# Patient Record
Sex: Female | Born: 1975 | Race: White | Hispanic: No | Marital: Single | Smoking: Never smoker
Health system: Southern US, Community
[De-identification: ages and names within clinical notes are randomized; demographics above are authoritative.]

## PROBLEM LIST (undated history)

## (undated) DIAGNOSIS — D649 Anemia, unspecified: Secondary | ICD-10-CM

## (undated) DIAGNOSIS — J45909 Unspecified asthma, uncomplicated: Secondary | ICD-10-CM

## (undated) DIAGNOSIS — E079 Disorder of thyroid, unspecified: Secondary | ICD-10-CM

## (undated) HISTORY — PX: GASTRIC BYPASS: SHX52

## (undated) HISTORY — PX: OTHER SURGICAL HISTORY: SHX169

---

## 1998-12-01 ENCOUNTER — Emergency Department (HOSPITAL_COMMUNITY): Admission: EM | Admit: 1998-12-01 | Discharge: 1998-12-01 | Payer: Self-pay | Admitting: Family Medicine

## 2003-10-12 ENCOUNTER — Encounter: Admission: RE | Admit: 2003-10-12 | Discharge: 2003-10-12 | Payer: Self-pay | Admitting: Family Medicine

## 2003-10-12 ENCOUNTER — Encounter: Payer: Self-pay | Admitting: Family Medicine

## 2003-10-16 ENCOUNTER — Encounter: Admission: RE | Admit: 2003-10-16 | Discharge: 2003-10-16 | Payer: Self-pay | Admitting: Family Medicine

## 2011-01-07 ENCOUNTER — Encounter: Payer: Self-pay | Admitting: Family Medicine

## 2011-01-08 ENCOUNTER — Encounter: Payer: Self-pay | Admitting: Family Medicine

## 2021-06-01 ENCOUNTER — Ambulatory Visit: Payer: Self-pay | Admitting: *Deleted

## 2021-06-01 NOTE — Telephone Encounter (Signed)
Itchy and burning red rash appeared this morning on left arm. Bumpy and raised no drainage/scaling. Since first appeared several spots now on other arm and forehead. No other symptoms. No new lotions, etc. Was in contact with someone with poison oak recently.  Provided advice on relieving the itching and preventing spreading. Calamine lotion, antihistamine and protection from touching, do not scratch. If turns purple, spreads near the mouth/eyes, worsens call back. Patient understands advice.   Reason for Disposition  SEVERE itching (i.e., interferes with sleep, normal activities or school)  Answer Assessment - Initial Assessment Questions 1. APPEARANCE of RASH: "Describe the rash." (e.g., spots, blisters, raised areas, skin peeling, scaly)     Bumpy and feels like sandpaper 2. SIZE: "How big are the spots?" (e.g., tip of pen, eraser, coin; inches, centimeters)    Eraser and dime side 3. LOCATION: "Where is the rash located?"     One arm and spots 4. COLOR: "What color is the rash?" (Note: It is difficult to assess rash color in people with darker-colored skin. When this situation occurs, simply ask the caller to describe what they see.)   red 5. ONSET: "When did the rash begin?"     yesterday 6. FEVER: "Do you have a fever?" If Yes, ask: "What is your temperature, how was it measured, and when did it start?"     no 7. ITCHING: "Does the rash itch?" If Yes, ask: "How bad is the itch?" (Scale 1-10; or mild, moderate, severe)    Itch and burns 8. CAUSE: "What do you think is causing the rash?"     unknown 9. MEDICINE FACTORS: "Have you started any new medicines within the last 2 weeks?" (e.g., antibiotics)      no 10. OTHER SYMPTOMS: "Do you have any other symptoms?" (e.g., dizziness, headache, sore throat, joint pain)       no 11. PREGNANCY: "Is there any chance you are pregnant?" "When was your last menstrual period?"       na  Protocols used: Rash or Redness - [facility]

## 2021-06-01 NOTE — Telephone Encounter (Signed)
Itchy burning rash developed this morning on

## 2021-08-21 ENCOUNTER — Other Ambulatory Visit: Payer: Self-pay

## 2021-08-21 ENCOUNTER — Emergency Department (HOSPITAL_COMMUNITY)
Admission: EM | Admit: 2021-08-21 | Discharge: 2021-08-21 | Disposition: A | Discharge status: Home / Self Care | Payer: Self-pay | Attending: Emergency Medicine | Admitting: Emergency Medicine

## 2021-08-21 ENCOUNTER — Encounter (HOSPITAL_COMMUNITY): Payer: Self-pay

## 2021-08-21 DIAGNOSIS — R112 Nausea with vomiting, unspecified: Secondary | ICD-10-CM | POA: Insufficient documentation

## 2021-08-21 DIAGNOSIS — R197 Diarrhea, unspecified: Secondary | ICD-10-CM | POA: Insufficient documentation

## 2021-08-21 DIAGNOSIS — Z5321 Procedure and treatment not carried out due to patient leaving prior to being seen by health care provider: Secondary | ICD-10-CM | POA: Insufficient documentation

## 2021-08-21 DIAGNOSIS — R6883 Chills (without fever): Secondary | ICD-10-CM | POA: Insufficient documentation

## 2021-08-21 LAB — CBC
HCT: 30.7 % — ABNORMAL LOW (ref 36.0–46.0)
Hemoglobin: 9.1 g/dL — ABNORMAL LOW (ref 12.0–15.0)
MCH: 22.3 pg — ABNORMAL LOW (ref 26.0–34.0)
MCHC: 29.6 g/dL — ABNORMAL LOW (ref 30.0–36.0)
MCV: 75.2 fL — ABNORMAL LOW (ref 80.0–100.0)
Platelets: 410 10*3/uL — ABNORMAL HIGH (ref 150–400)
RBC: 4.08 MIL/uL (ref 3.87–5.11)
RDW: 18.8 % — ABNORMAL HIGH (ref 11.5–15.5)
WBC: 6.9 10*3/uL (ref 4.0–10.5)
nRBC: 0 % (ref 0.0–0.2)

## 2021-08-21 LAB — COMPREHENSIVE METABOLIC PANEL
ALT: 12 U/L (ref 0–44)
AST: 19 U/L (ref 15–41)
Albumin: 4.2 g/dL (ref 3.5–5.0)
Alkaline Phosphatase: 57 U/L (ref 38–126)
Anion gap: 11 (ref 5–15)
BUN: 7 mg/dL (ref 6–20)
CO2: 24 mmol/L (ref 22–32)
Calcium: 9 mg/dL (ref 8.9–10.3)
Chloride: 100 mmol/L (ref 98–111)
Creatinine, Ser: 0.44 mg/dL (ref 0.44–1.00)
GFR, Estimated: >60 mL/min (ref >60)
Glucose, Bld: 107 mg/dL — ABNORMAL HIGH (ref 70–99)
Potassium: 3.6 mmol/L (ref 3.5–5.1)
Sodium: 135 mmol/L (ref 135–145)
Total Bilirubin: 1 mg/dL (ref 0.3–1.2)
Total Protein: 7.7 g/dL (ref 6.5–8.1)

## 2021-08-21 LAB — LIPASE, BLOOD: Lipase: 21 U/L (ref 11–51)

## 2021-08-21 LAB — I-STAT BETA HCG BLOOD, ED (MC, WL, AP ONLY): I-stat hCG, quantitative: <5 m[IU]/mL (ref <5)

## 2021-08-21 MED ORDER — ONDANSETRON 4 MG PO TBDP
4.0000 mg | ORAL_TABLET | Freq: Once | ORAL | Status: AC
  Administered 2021-08-21: 4 mg via ORAL
  Filled 2021-08-21: qty 1

## 2021-08-21 NOTE — ED Provider Notes (Signed)
Emergency Medicine Provider Triage Evaluation Note  Karen Marshall , a 45 y.o. female  was evaluated in triage.  Pt complains of nausea, vomiting and diarrhea.  She says this started today with chills.  She denies any fevers. No severe abdominal pain.  History of similar. She has used zofran before but ran out.   Review of Systems  Positive: N/V/D Negative: syncope  Physical Exam  BP (!) 181/93   Pulse 71   Temp 98 F (36.7 C) (Oral)   Resp 18   SpO2 99%  Gen:   Awake, no distress   Resp:  Normal effort  MSK:   Moves extremities without difficulty  Other:  Appears to feel unwell.   Medical Decision Making  Medically screening exam initiated at 6:16 PM.  Appropriate orders placed.  Karen Marshall was informed that the remainder of the evaluation will be completed by another provider, this initial triage assessment does not replace that evaluation, and the importance of remaining in the ED until their evaluation is complete.  Note: Portions of this report may have been transcribed using voice recognition software. Every effort was made to ensure accuracy; however, inadvertent computerized transcription errors may be present    Norman Clay 08/21/21 1819    Linwood Dibbles, MD 08/22/21 [phone]

## 2021-08-21 NOTE — ED Triage Notes (Signed)
Pt reports N/V/D and chills this morning. Pt reports being unable to keep anything down.

## 2021-08-22 ENCOUNTER — Emergency Department (HOSPITAL_COMMUNITY)
Admission: EM | Admit: 2021-08-22 | Discharge: 2021-08-22 | Disposition: A | Discharge status: Home / Self Care | Payer: Self-pay | Attending: Emergency Medicine | Admitting: Emergency Medicine

## 2021-08-22 ENCOUNTER — Emergency Department (HOSPITAL_COMMUNITY): Payer: Self-pay

## 2021-08-22 DIAGNOSIS — R112 Nausea with vomiting, unspecified: Secondary | ICD-10-CM | POA: Insufficient documentation

## 2021-08-22 DIAGNOSIS — R109 Unspecified abdominal pain: Secondary | ICD-10-CM

## 2021-08-22 DIAGNOSIS — R197 Diarrhea, unspecified: Secondary | ICD-10-CM | POA: Insufficient documentation

## 2021-08-22 DIAGNOSIS — R1013 Epigastric pain: Secondary | ICD-10-CM | POA: Insufficient documentation

## 2021-08-22 LAB — CBC
HCT: 30.1 % — ABNORMAL LOW (ref 36.0–46.0)
Hemoglobin: 8.8 g/dL — ABNORMAL LOW (ref 12.0–15.0)
MCH: 22 pg — ABNORMAL LOW (ref 26.0–34.0)
MCHC: 29.2 g/dL — ABNORMAL LOW (ref 30.0–36.0)
MCV: 75.3 fL — ABNORMAL LOW (ref 80.0–100.0)
Platelets: 401 10*3/uL — ABNORMAL HIGH (ref 150–400)
RBC: 4 MIL/uL (ref 3.87–5.11)
RDW: 18.7 % — ABNORMAL HIGH (ref 11.5–15.5)
WBC: 5.7 10*3/uL (ref 4.0–10.5)
nRBC: 0 % (ref 0.0–0.2)

## 2021-08-22 LAB — COMPREHENSIVE METABOLIC PANEL
ALT: 11 U/L (ref 0–44)
AST: 19 U/L (ref 15–41)
Albumin: 4.1 g/dL (ref 3.5–5.0)
Alkaline Phosphatase: 55 U/L (ref 38–126)
Anion gap: 8 (ref 5–15)
BUN: 8 mg/dL (ref 6–20)
CO2: 27 mmol/L (ref 22–32)
Calcium: 9.1 mg/dL (ref 8.9–10.3)
Chloride: 101 mmol/L (ref 98–111)
Creatinine, Ser: 0.57 mg/dL (ref 0.44–1.00)
GFR, Estimated: >60 mL/min (ref >60)
Glucose, Bld: 97 mg/dL (ref 70–99)
Potassium: 3.3 mmol/L — ABNORMAL LOW (ref 3.5–5.1)
Sodium: 136 mmol/L (ref 135–145)
Total Bilirubin: 1.3 mg/dL — ABNORMAL HIGH (ref 0.3–1.2)
Total Protein: 7.5 g/dL (ref 6.5–8.1)

## 2021-08-22 LAB — LIPASE, BLOOD: Lipase: 23 U/L (ref 11–51)

## 2021-08-22 MED ORDER — FENTANYL CITRATE PF 50 MCG/ML IJ SOSY
25.0000 ug | PREFILLED_SYRINGE | Freq: Once | INTRAMUSCULAR | Status: AC
  Administered 2021-08-22: 25 ug via INTRAVENOUS
  Filled 2021-08-22: qty 1

## 2021-08-22 MED ORDER — ONDANSETRON HCL 4 MG/2ML IJ SOLN
4.0000 mg | Freq: Once | INTRAMUSCULAR | Status: AC
  Administered 2021-08-22: 4 mg via INTRAVENOUS
  Filled 2021-08-22: qty 2

## 2021-08-22 MED ORDER — SUCRALFATE 1 G PO TABS: 1.0000 g | ORAL_TABLET | Freq: Four times a day (QID) | ORAL | 0 refills | Status: AC | PRN

## 2021-08-22 MED ORDER — IOHEXOL 350 MG/ML SOLN
80.0000 mL | Freq: Once | INTRAVENOUS | Status: AC | PRN
  Administered 2021-08-22: 80 mL via INTRAVENOUS

## 2021-08-22 MED ORDER — FAMOTIDINE IN NACL 20-0.9 MG/50ML-% IV SOLN
20.0000 mg | Freq: Once | INTRAVENOUS | Status: AC
  Administered 2021-08-22: 20 mg via INTRAVENOUS
  Filled 2021-08-22: qty 50

## 2021-08-22 MED ORDER — SODIUM CHLORIDE 0.9 % IV BOLUS
1000.0000 mL | Freq: Once | INTRAVENOUS | Status: AC
  Administered 2021-08-22: 1000 mL via INTRAVENOUS

## 2021-08-22 MED ORDER — ONDANSETRON 4 MG PO TBDP: 4.0000 mg | ORAL_TABLET | Freq: Three times a day (TID) | ORAL | 0 refills | Status: DC | PRN

## 2021-08-22 NOTE — ED Triage Notes (Signed)
Pt complains of nausea, abdominal pain, and diarrhea. 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

## 2021-08-22 NOTE — Discharge Instructions (Addendum)
You were evaluated in the Emergency Department and after careful evaluation, we did not find any emergent condition requiring admission or further testing in the hospital.  Your exam/testing today is overall reassuring.  Symptoms seem to be due to inflammation of the lining of the stomach.  Continue taking your home antiacid medications.  You can also take the Carafate medication up to 4 times daily for immediate relief.  Can use the Zofran nausea medicine as needed.  Please return to the Emergency Department if you experience any worsening of your condition.   Thank you for allowing [place] to be a part of your care.

## 2021-08-22 NOTE — ED Provider Notes (Addendum)
[facility]-EMERGENCY DEPT [facility] Emergency Department Provider Note MRN:  [id number]  Arrival date & time: 08/22/21     Chief Complaint   Abdominal Pain, Nausea, and Diarrhea   History of Present Illness   Lucynda Rosano Casseus is a 45 y.o. year-old female with a history of gastric bypass presenting to the ED with chief complaint of abdominal pain.  Location: Epigastrium Duration: Several hours Onset: Gradual Timing: Constant Description: Sharp pressure Severity: Severe Exacerbating/Alleviating Factors: None Associated Symptoms: Nausea vomiting and diarrhea Pertinent Negatives: No chest pain or shortness of breath  Additional History: None  Review of Systems  A complete 10 system review of systems was obtained and all systems are negative except as noted in the HPI and PMH.   Patient's Health History   No past medical history on file.  Past Surgical History:  Procedure Laterality Date   gastic bypass      No family history on file.  Social History   Socioeconomic History   Marital status: Single    Spouse name: Not on file   Number of children: Not on file   Years of education: Not on file   Highest education level: Not on file  Occupational History   Not on file  Tobacco Use   Smoking status: Not on file   Smokeless tobacco: Not on file  Substance and Sexual Activity   Alcohol use: Not on file   Drug use: Not on file   Sexual activity: Not on file  Other Topics Concern   Not on file  Social History Narrative   Not on file   Social Determinants of Health   Financial Resource Strain: Not on file  Food Insecurity: Not on file  Transportation Needs: Not on file  Physical Activity: Not on file  Stress: Not on file  Social Connections: Not on file  Intimate Partner Violence: Not on file     Physical Exam   Vitals:   08/22/21 0700 08/22/21 0724  BP: (!) 145/104 (!) 151/92  Pulse: (!) 48 (!) 51  Resp: 20 18  Temp:  98.2 F (36.8 C)  SpO2: 100% 98%     CONSTITUTIONAL: Well-appearing, NAD NEURO:  Alert and oriented x 3, no focal deficits EYES:  eyes equal and reactive ENT/NECK:  no LAD, no JVD CARDIO: Regular rate, well-perfused, normal S1 and S2 PULM:  CTAB no wheezing or rhonchi GI/GU:  normal bowel sounds, non-distended, non-tender MSK/SPINE:  No gross deformities, no edema SKIN:  no rash, atraumatic PSYCH:  Appropriate speech and behavior  *Additional and/or pertinent findings included in MDM below  Diagnostic and Interventional Summary    EKG Interpretation  Date/Time:    Ventricular Rate:    PR Interval:    QRS Duration:   QT Interval:    QTC Calculation:   R Axis:     Text Interpretation:         Labs Reviewed  CBC - Abnormal; Notable for the following components:      Result Value   Hemoglobin 8.8 (*)    HCT 30.1 (*)    MCV 75.3 (*)    MCH 22.0 (*)    MCHC 29.2 (*)    RDW 18.7 (*)    Platelets 401 (*)    All other components within normal limits  COMPREHENSIVE METABOLIC PANEL - Abnormal; Notable for the following components:   Potassium 3.3 (*)    Total Bilirubin 1.3 (*)    All other components within normal limits  LIPASE, BLOOD    CT ABDOMEN PELVIS W CONTRAST  Final Result      Medications  sodium chloride 0.9 % bolus 1,000 mL (0 mLs Intravenous Stopped 08/22/21 0601)  ondansetron (ZOFRAN) injection 4 mg (4 mg Intravenous Given 08/22/21 0459)  fentaNYL (SUBLIMAZE) injection 25 mcg (25 mcg Intravenous Given 08/22/21 0500)  famotidine (PEPCID) IVPB 20 mg premix (0 mg Intravenous Stopped 08/22/21 0535)  iohexol (OMNIPAQUE) 350 MG/ML injection 80 mL (80 mLs Intravenous Contrast Given 08/22/21 0636)     Procedures  /  Critical Care Procedures  ED Course and Medical Decision Making  I have reviewed the triage vital signs, the nursing notes, and pertinent available records from the EMR.  Listed above are laboratory and imaging tests that I personally ordered, reviewed, and interpreted and then considered  in my medical decision making (see below for details).  Suspect gastritis, also considering pancreatitis, complication of gastric bypass, awaiting labs, CT.     CT is normal, patient is feeling much better after medications listed above, suspect gastritis or GERD, appropriate for discharge.  Elmer Sow. Pilar Plate, MD [facility] Emergency Medicine [facility] [email or web address]  Final Clinical Impressions(s) / ED Diagnoses     ICD-10-CM   1. Abdominal pain, unspecified abdominal location  R10.9       ED Discharge Orders          Ordered    ondansetron (ZOFRAN ODT) 4 MG disintegrating tablet  Every 8 hours PRN        08/22/21 0729    sucralfate (CARAFATE) 1 g tablet  4 times daily PRN        08/22/21 0729             Discharge Instructions Discussed with and Provided to Patient:     Discharge Instructions      You were evaluated in the Emergency Department and after careful evaluation, we did not find any emergent condition requiring admission or further testing in the hospital.  Your exam/testing today is overall reassuring.  Symptoms seem to be due to inflammation of the lining of the stomach.  Continue taking your home antiacid medications.  You can also take the Carafate medication up to 4 times daily for immediate relief.  Can use the Zofran nausea medicine as needed.  Please return to the Emergency Department if you experience any worsening of your condition.   Thank you for allowing [place] to be a part of your care.        Sabas Sous, MD 08/22/21 2409    Sabas Sous, MD 08/22/21 0730

## 2021-10-15 ENCOUNTER — Emergency Department (HOSPITAL_COMMUNITY)
Admission: EM | Admit: 2021-10-15 | Discharge: 2021-10-15 | Disposition: A | Discharge status: Home / Self Care | Payer: BC Managed Care – PPO | Attending: Emergency Medicine | Admitting: Emergency Medicine

## 2021-10-15 ENCOUNTER — Emergency Department (HOSPITAL_COMMUNITY): Payer: BC Managed Care – PPO

## 2021-10-15 ENCOUNTER — Other Ambulatory Visit: Payer: Self-pay

## 2021-10-15 DIAGNOSIS — R519 Headache, unspecified: Secondary | ICD-10-CM | POA: Diagnosis present

## 2021-10-15 DIAGNOSIS — Z20822 Contact with and (suspected) exposure to covid-19: Secondary | ICD-10-CM | POA: Diagnosis not present

## 2021-10-15 DIAGNOSIS — J101 Influenza due to other identified influenza virus with other respiratory manifestations: Secondary | ICD-10-CM | POA: Diagnosis not present

## 2021-10-15 LAB — BASIC METABOLIC PANEL
Anion gap: 10 (ref 5–15)
BUN: 6 mg/dL (ref 6–20)
CO2: 28 mmol/L (ref 22–32)
Calcium: 9 mg/dL (ref 8.9–10.3)
Chloride: 95 mmol/L — ABNORMAL LOW (ref 98–111)
Creatinine, Ser: 0.4 mg/dL — ABNORMAL LOW (ref 0.44–1.00)
GFR, Estimated: >60 mL/min (ref >60)
Glucose, Bld: 106 mg/dL — ABNORMAL HIGH (ref 70–99)
Potassium: 3.6 mmol/L (ref 3.5–5.1)
Sodium: 133 mmol/L — ABNORMAL LOW (ref 135–145)

## 2021-10-15 LAB — RESP PANEL BY RT-PCR (FLU A&B, COVID) ARPGX2
Influenza A by PCR: POSITIVE — AB
Influenza B by PCR: NEGATIVE
SARS Coronavirus 2 by RT PCR: NEGATIVE

## 2021-10-15 LAB — CBC WITH DIFFERENTIAL/PLATELET
Abs Immature Granulocytes: 0.02 10*3/uL (ref 0.00–0.07)
Basophils Absolute: 0 10*3/uL (ref 0.0–0.1)
Basophils Relative: 0 %
Eosinophils Absolute: 0 10*3/uL (ref 0.0–0.5)
Eosinophils Relative: 0 %
HCT: 28.9 % — ABNORMAL LOW (ref 36.0–46.0)
Hemoglobin: 8.4 g/dL — ABNORMAL LOW (ref 12.0–15.0)
Immature Granulocytes: 1 %
Lymphocytes Relative: 22 %
Lymphs Abs: 0.6 10*3/uL — ABNORMAL LOW (ref 0.7–4.0)
MCH: 22.1 pg — ABNORMAL LOW (ref 26.0–34.0)
MCHC: 29.1 g/dL — ABNORMAL LOW (ref 30.0–36.0)
MCV: 76.1 fL — ABNORMAL LOW (ref 80.0–100.0)
Monocytes Absolute: 0.5 10*3/uL (ref 0.1–1.0)
Monocytes Relative: 19 %
Neutro Abs: 1.6 10*3/uL — ABNORMAL LOW (ref 1.7–7.7)
Neutrophils Relative %: 58 %
Platelets: 249 10*3/uL (ref 150–400)
RBC: 3.8 MIL/uL — ABNORMAL LOW (ref 3.87–5.11)
RDW: 22.5 % — ABNORMAL HIGH (ref 11.5–15.5)
WBC: 2.7 10*3/uL — ABNORMAL LOW (ref 4.0–10.5)
nRBC: 0 % (ref 0.0–0.2)

## 2021-10-15 LAB — I-STAT BETA HCG BLOOD, ED (MC, WL, AP ONLY): I-stat hCG, quantitative: <5 m[IU]/mL

## 2021-10-15 MED ORDER — ONDANSETRON 4 MG PO TBDP
4.0000 mg | ORAL_TABLET | Freq: Once | ORAL | Status: AC
  Administered 2021-10-15: 4 mg via ORAL
  Filled 2021-10-15: qty 1

## 2021-10-15 MED ORDER — ONDANSETRON 4 MG PO TBDP: 4.0000 mg | ORAL_TABLET | Freq: Three times a day (TID) | ORAL | 0 refills | Status: AC | PRN

## 2021-10-15 NOTE — ED Provider Notes (Signed)
[facility]-EMERGENCY DEPT Provider Note   CSN: [id number] Arrival date & time: 10/15/21  0125     History Chief Complaint  Patient presents with   Nasal Congestion   Headache   Cough    Karen Marshall is a 45 y.o. female who presents with concern for lightheadedness, cough, congestion, body aches x4 days.  Was diagnosed with adverse transaction at urgent care.  Anorexia, diarrhea without melena or hematochezia.  Tolerating p.o. though mild nausea.  Have personally reviewed this patient's medical records. She has history of hypothyroidism on synthroid, per patient.  HPI     No past medical history on file.  There are no problems to display for this patient.   Past Surgical History:  Procedure Laterality Date   gastic bypass       OB History   No obstetric history on file.     No family history on file.     Home Medications Prior to Admission medications   Medication Sig Start Date End Date Taking? Authorizing Provider  ondansetron (ZOFRAN ODT) 4 MG disintegrating tablet Take 1 tablet (4 mg total) by mouth every 8 (eight) hours as needed for nausea or vomiting. 10/15/21  Yes Ashlley Booher R, PA-C  sucralfate (CARAFATE) 1 g tablet Take 1 tablet (1 g total) by mouth 4 (four) times daily as needed. 08/22/21   Sabas Sous, MD    Allergies    Patient has no known allergies.  Review of Systems   Review of Systems  Constitutional:  Positive for activity change, appetite change, chills, fatigue and fever.  HENT:  Positive for congestion and sinus pressure. Negative for sore throat, trouble swallowing and voice change.   Respiratory:  Positive for cough. Negative for chest tightness and shortness of breath.   Cardiovascular: Negative.   Gastrointestinal:  Positive for diarrhea and nausea. Negative for abdominal distention, abdominal pain, anal bleeding, blood in stool, constipation and vomiting.  Genitourinary: Negative.    Musculoskeletal:  Positive for myalgias.  Skin: Negative.   Neurological:  Positive for headaches. Negative for dizziness, tremors, syncope, weakness and light-headedness.   Physical Exam Updated Vital Signs BP (!) 172/99 (BP Location: Left Arm)   Pulse 66   Temp 99 F (37.2 C)   Resp 19   Ht 5\' 5"  (1.651 m)   Wt 102.1 kg   SpO2 100%   BMI 37.44 kg/m   Physical Exam Vitals and nursing note reviewed.  Constitutional:      Appearance: She is obese. She is not ill-appearing or toxic-appearing.  HENT:     Head: Normocephalic and atraumatic.     Nose: Nose normal.     Mouth/Throat:     Mouth: Mucous membranes are moist.     Pharynx: Oropharynx is clear. Uvula midline. No oropharyngeal exudate or posterior oropharyngeal erythema.     Tonsils: No tonsillar exudate.  Eyes:     General: Lids are normal. Vision grossly intact.        Right eye: No discharge.        Left eye: No discharge.     Extraocular Movements: Extraocular movements intact.     Conjunctiva/sclera: Conjunctivae normal.     Pupils: Pupils are equal, round, and reactive to light.  Neck:     Trachea: Trachea and phonation normal.  Cardiovascular:     Rate and Rhythm: Normal rate and regular rhythm.     Pulses: Normal pulses.     Heart sounds: Normal heart  sounds. No murmur heard. Pulmonary:     Effort: Pulmonary effort is normal. No tachypnea, bradypnea, accessory muscle usage, prolonged expiration or respiratory distress.     Breath sounds: Normal breath sounds. No wheezing or rales.  Chest:     Chest wall: No mass, lacerations, deformity, swelling, tenderness, crepitus or edema.  Abdominal:     General: Bowel sounds are normal. There is no distension.     Palpations: Abdomen is soft.     Tenderness: There is no abdominal tenderness. There is no right CVA tenderness, left CVA tenderness, guarding or rebound.  Musculoskeletal:        General: No deformity.     Cervical back: Normal range of motion and neck  supple. No edema, rigidity or crepitus. No pain with movement, spinous process tenderness or muscular tenderness.     Right lower leg: No edema.     Left lower leg: No edema.  Lymphadenopathy:     Cervical: No cervical adenopathy.  Skin:    General: Skin is warm and dry.     Capillary Refill: Capillary refill takes less than 2 seconds.     Findings: No rash.  Neurological:     Mental Status: She is alert. Mental status is at baseline.     GCS: GCS eye subscore is 4. GCS verbal subscore is 5. GCS motor subscore is 6.     Gait: Gait is intact. Gait normal.  Psychiatric:        Mood and Affect: Mood normal.    ED Results / Procedures / Treatments   Labs (all labs ordered are listed, but only abnormal results are displayed) Labs Reviewed  RESP PANEL BY RT-PCR (FLU A&B, COVID) ARPGX2 - Abnormal; Notable for the following components:      Result Value   Influenza A by PCR POSITIVE (*)    All other components within normal limits  CBC WITH DIFFERENTIAL/PLATELET - Abnormal; Notable for the following components:   WBC 2.7 (*)    RBC 3.80 (*)    Hemoglobin 8.4 (*)    HCT 28.9 (*)    MCV 76.1 (*)    MCH 22.1 (*)    MCHC 29.1 (*)    RDW 22.5 (*)    Neutro Abs 1.6 (*)    Lymphs Abs 0.6 (*)    All other components within normal limits  BASIC METABOLIC PANEL - Abnormal; Notable for the following components:   Sodium 133 (*)    Chloride 95 (*)    Glucose, Bld 106 (*)    Creatinine, Ser 0.40 (*)    All other components within normal limits  I-STAT BETA HCG BLOOD, ED ([facility], [facility], [facility] ONLY)    EKG None  Radiology DG Chest Portable 1 View  Result Date: 10/15/2021 CLINICAL DATA:  Cough, dyspnea, chest tightness EXAM: PORTABLE CHEST 1 VIEW COMPARISON:  None. FINDINGS: The heart size and mediastinal contours are within normal limits. Both lungs are clear. The visualized skeletal structures are unremarkable. IMPRESSION: No active disease. Electronically Signed   By: Helyn Numbers M.D.   On:  10/15/2021 03:35    Procedures Procedures   Medications Ordered in ED Medications  ondansetron (ZOFRAN-ODT) disintegrating tablet 4 mg (4 mg Oral Given 10/15/21 0959)    ED Course  I have reviewed the triage vital signs and the nursing notes.  Pertinent labs & imaging results that were available during my care of the patient were reviewed by me and considered in my medical decision making (  see chart for details).    MDM Rules/Calculators/A&P                         45 year old female who presents with concern for body aches, congestion, cough, and fatigue x 4 days.  Differential diagnose includes but is not limited to acute viral infection, influenza, COVID-19, pneumonia.  Hypertensive on intake, vitals otherwise normal.  Cardiopulmonary exam is normal, abdominal exam is benign.  HEENT exam with rhinorrhea and nasal congestion otherwise unremarkable.  Patient is neurovascular intact in all 4 extremities.  CBC with anemia at patient's baseline with hemoglobin of 8.4.  BMP with mild hyponatremia 133 but normal creatinine.  Patient did test positive for influenza A.  Chest x-ray negative for acute cardiopulmonary disease.  Due to feeling further work-up is warranted in the ER this time given reassuring cardiopulmonary exam and vital signs as well as laboratory studies at patient's baseline.  She does not clinically appear dehydrated.  Suspect her symptoms are indeed secondary to her influenza A infection.  She is outside the window for Tamiflu.  Recommend oral rehydration therapy at home, over-the-counter medications for symptom management as needed, and lots of rest.  No further work-up warranted in the ER at this time.  Karen Marshall voiced understanding of her medical evaluation and treatment plan.  Each of her questions was answered to her expressed satisfaction.  Return precautions were given.  Patient is stable and appropriate for discharge at this time.  This chart was dictated using  voice recognition software, Dragon. Despite the best efforts of this provider to proofread and correct errors, errors may still occur which can change documentation meaning.   Final Clinical Impression(s) / ED Diagnoses Final diagnoses:  Influenza A    Rx / DC Orders ED Discharge Orders          Ordered    ondansetron (ZOFRAN ODT) 4 MG disintegrating tablet  Every 8 hours PRN        10/15/21 0943             Randle Shatzer, Eugene Gavia, PA-C 10/15/21 1049    Derwood Kaplan, MD 10/15/21 1123

## 2021-10-15 NOTE — ED Provider Notes (Signed)
Emergency Medicine Provider Triage Evaluation Note  Karen Marshall , a 45 y.o. female with a history of gastric bypass was evaluated in triage.  Pt complains of lightheadedness.  The patient reports that she was seen at urgent care and diagnosed with an upper respiratory infection 4 days ago after she presented with a 2-day history of productive cough, cough, chest tightness that is only present with coughing, nasal congestion, body aches.  She reports that she has had no appetite and has been trying to force herself to eat and drink.  She has been having diarrhea.  She has only voided once over the last 24 hours.  Tonight, she was at work, and was sent home because she was feeling very lightheaded.  Lightheadedness is worse with standing.  She also endorses a generalized headache that she characterizes as throbbing.  She was discharged with cough and cold medication, which she has been taking without significant improvement.  No abdominal pain, vomiting, dysuria, syncope, numbness, weakness, neck or jaw pain, leg swelling.  Review of Systems  Positive: Cough, congestion, shortness of breath, lightheadedness, chest tightness, diarrhea, body aches Negative: Vomiting, rash, dysuria, syncope, numbness, weakness, neck pain, jaw pain, leg swelling, abdominal pain  Physical Exam  BP (!) 166/97 (BP Location: Left Arm)   Pulse 60   Temp 98 F (36.7 C) (Oral)   Resp 18   Ht 5\' 5"  (1.651 m)   Wt 102.1 kg   SpO2 99%   BMI 37.44 kg/m  Gen:   Awake, no distress   Resp:  Normal effort  MSK:   Moves extremities without difficulty  Other:  Heart is regular rate and rhythm without murmurs rubs or gallops.  Medical Decision Making  Medically screening exam initiated at 2:26 AM.  Appropriate orders placed.  Zamari R Zamorano was informed that the remainder of the evaluation will be completed by another provider, this initial triage assessment does not replace that evaluation, and the importance of remaining  in the ED until their evaluation is complete.  Labs and imaging have been ordered.  She will require further work-up and evaluation in the emergency department.   , PA-C 10/15/21 0254    10/17/21, MD 10/16/21 [id number]

## 2021-10-15 NOTE — Discharge Instructions (Signed)
You were seen in the ER today for your body aches and chills. You were diagnosed with influenza A. You may take tylenol as needed and have been prescribed nausea medicine addition to take as needed.  Please increase your daily hydration at home incorporating electrolyte drinks and return to the ER with any worsening abdominal pain, nausea or vomiting does not stop, difficulty breathing, or any other new severe symptom.

## 2021-10-15 NOTE — ED Triage Notes (Signed)
Pt was dx with an upper respiratory 4 days ago. Pt complains of worsening symptoms with mucus, congestion, nasal congestion, and headache.

## 2021-10-21 ENCOUNTER — Emergency Department (HOSPITAL_COMMUNITY): Payer: BC Managed Care – PPO

## 2021-10-21 ENCOUNTER — Emergency Department (HOSPITAL_COMMUNITY)
Admission: EM | Admit: 2021-10-21 | Discharge: 2021-10-21 | Disposition: A | Discharge status: Home / Self Care | Payer: BC Managed Care – PPO | Attending: Emergency Medicine | Admitting: Emergency Medicine

## 2021-10-21 ENCOUNTER — Encounter (HOSPITAL_COMMUNITY): Payer: Self-pay

## 2021-10-21 DIAGNOSIS — R600 Localized edema: Secondary | ICD-10-CM | POA: Insufficient documentation

## 2021-10-21 DIAGNOSIS — N9489 Other specified conditions associated with female genital organs and menstrual cycle: Secondary | ICD-10-CM | POA: Diagnosis not present

## 2021-10-21 DIAGNOSIS — R0789 Other chest pain: Secondary | ICD-10-CM | POA: Insufficient documentation

## 2021-10-21 DIAGNOSIS — R0602 Shortness of breath: Secondary | ICD-10-CM | POA: Diagnosis present

## 2021-10-21 DIAGNOSIS — J101 Influenza due to other identified influenza virus with other respiratory manifestations: Secondary | ICD-10-CM

## 2021-10-21 DIAGNOSIS — R5383 Other fatigue: Secondary | ICD-10-CM | POA: Diagnosis not present

## 2021-10-21 DIAGNOSIS — R059 Cough, unspecified: Secondary | ICD-10-CM | POA: Insufficient documentation

## 2021-10-21 DIAGNOSIS — D649 Anemia, unspecified: Secondary | ICD-10-CM | POA: Insufficient documentation

## 2021-10-21 LAB — CBC WITH DIFFERENTIAL/PLATELET
Abs Immature Granulocytes: 0.09 10*3/uL — ABNORMAL HIGH (ref 0.00–0.07)
Basophils Absolute: 0 10*3/uL (ref 0.0–0.1)
Basophils Relative: 0 %
Eosinophils Absolute: 0 10*3/uL (ref 0.0–0.5)
Eosinophils Relative: 0 %
HCT: 28.4 % — ABNORMAL LOW (ref 36.0–46.0)
Hemoglobin: 8.3 g/dL — ABNORMAL LOW (ref 12.0–15.0)
Immature Granulocytes: 1 %
Lymphocytes Relative: 21 %
Lymphs Abs: 1.7 10*3/uL (ref 0.7–4.0)
MCH: 22.1 pg — ABNORMAL LOW (ref 26.0–34.0)
MCHC: 29.2 g/dL — ABNORMAL LOW (ref 30.0–36.0)
MCV: 75.7 fL — ABNORMAL LOW (ref 80.0–100.0)
Monocytes Absolute: 1 10*3/uL (ref 0.1–1.0)
Monocytes Relative: 13 %
Neutro Abs: 5.2 10*3/uL (ref 1.7–7.7)
Neutrophils Relative %: 65 %
Platelets: 289 10*3/uL (ref 150–400)
RBC: 3.75 MIL/uL — ABNORMAL LOW (ref 3.87–5.11)
RDW: 22.2 % — ABNORMAL HIGH (ref 11.5–15.5)
WBC: 8 10*3/uL (ref 4.0–10.5)
nRBC: 0 % (ref 0.0–0.2)

## 2021-10-21 LAB — BASIC METABOLIC PANEL
Anion gap: 9 (ref 5–15)
BUN: 9 mg/dL (ref 6–20)
CO2: 28 mmol/L (ref 22–32)
Calcium: 8.5 mg/dL — ABNORMAL LOW (ref 8.9–10.3)
Chloride: 100 mmol/L (ref 98–111)
Creatinine, Ser: 0.44 mg/dL (ref 0.44–1.00)
GFR, Estimated: >60 mL/min (ref >60)
Glucose, Bld: 88 mg/dL (ref 70–99)
Potassium: 3.4 mmol/L — ABNORMAL LOW (ref 3.5–5.1)
Sodium: 137 mmol/L (ref 135–145)

## 2021-10-21 LAB — TROPONIN I (HIGH SENSITIVITY): Troponin I (High Sensitivity): 3 ng/L (ref <18)

## 2021-10-21 LAB — I-STAT BETA HCG BLOOD, ED (MC, WL, AP ONLY): I-stat hCG, quantitative: 7.7 m[IU]/mL — ABNORMAL HIGH (ref <5)

## 2021-10-21 LAB — D-DIMER, QUANTITATIVE: D-Dimer, Quant: 0.48 ug/mL-FEU (ref 0.00–0.50)

## 2021-10-21 MED ORDER — DEXAMETHASONE 4 MG PO TABS
10.0000 mg | ORAL_TABLET | Freq: Once | ORAL | Status: AC
  Administered 2021-10-21: 10 mg via ORAL
  Filled 2021-10-21: qty 2

## 2021-10-21 MED ORDER — OXYMETAZOLINE HCL 0.05 % NA SOLN
1.0000 | Freq: Once | NASAL | Status: AC
  Administered 2021-10-21: 1 via NASAL
  Filled 2021-10-21: qty 30

## 2021-10-21 MED ORDER — BENZONATATE 100 MG PO CAPS
100.0000 mg | ORAL_CAPSULE | Freq: Once | ORAL | Status: AC
  Administered 2021-10-21: 100 mg via ORAL
  Filled 2021-10-21: qty 1

## 2021-10-21 MED ORDER — ALBUTEROL SULFATE HFA 108 (90 BASE) MCG/ACT IN AERS
2.0000 | INHALATION_SPRAY | Freq: Once | RESPIRATORY_TRACT | Status: AC
  Administered 2021-10-21: 2 via RESPIRATORY_TRACT
  Filled 2021-10-21: qty 6.7

## 2021-10-21 MED ORDER — GUAIFENESIN 100 MG/5ML PO LIQD
10.0000 mL | Freq: Once | ORAL | Status: AC
  Administered 2021-10-21: 10 mL via ORAL
  Filled 2021-10-21: qty 10

## 2021-10-21 MED ORDER — ALBUTEROL SULFATE (2.5 MG/3ML) 0.083% IN NEBU
2.5000 mg | INHALATION_SOLUTION | Freq: Once | RESPIRATORY_TRACT | Status: AC
  Administered 2021-10-21: 2.5 mg via RESPIRATORY_TRACT
  Filled 2021-10-21: qty 3

## 2021-10-21 MED ORDER — BENZONATATE 100 MG PO CAPS: 200.0000 mg | ORAL_CAPSULE | Freq: Three times a day (TID) | ORAL | 0 refills | Status: DC

## 2021-10-21 NOTE — ED Triage Notes (Addendum)
Patient arrives from home with complaint of ongoing shortness of breath. Patient states she was diagnosed with Flu A x 5 days ago. Pt reports no improvement of symptoms since being diagnosed. Pt currently endorses cough, lightheadedness, weakness, and fatigue.

## 2021-10-21 NOTE — Discharge Instructions (Addendum)
You may use the Afrin nasal spray, one spray in each nostril twice daily for a total of three days.  You may use your albuterol inhaler, to puffs every four hours as needed for cough or wheeze.  You can take Mucinex or and over-the-counter cold medication as needed for cough.  You are anemic on your labs today. This is an ongoing issue and needs to be followed up by your family doctor for recheck.

## 2021-10-21 NOTE — ED Provider Notes (Addendum)
[facility]-EMERGENCY DEPT Provider Note   CSN: [id number] Arrival date & time: 10/21/21  0438     History Chief Complaint  Patient presents with   Shortness of Breath    Karen Marshall is a 45 y.o. female.  The history is provided by the patient and medical records.  Shortness of Breath Karen Marshall is a 45 y.o. female who presents to the Emergency Department complaining of sob. She started feeling sick about one week ago with cough, fatigue and chills. She was diagnosed with influenza but was not a Tamiflu candidate due to duration of symptoms. She was discharged home on cough syrup as well antinausea medications. Today she re-presents for evaluation due to feeling worse. She has progressive generalized weakness, fatigue. Her cough continues to be productive of green mucus. She has no associate hemoptysis. She also reports that central chest pressure and feeling like she cannot breathe. That is new for her starting today. She also has one week of left lower extremity swelling. She has no significant medical problems. No history of oral contraceptive use. She is a non-smoker. No history of lung disease. No history of DVT/PE.    History reviewed. No pertinent past medical history.  There are no problems to display for this patient.   Past Surgical History:  Procedure Laterality Date   gastic bypass     GASTRIC BYPASS       OB History   No obstetric history on file.     History reviewed. No pertinent family history.  Social History   Tobacco Use   Smoking status: Never   Smokeless tobacco: Never  Vaping Use   Vaping Use: Never used  Substance Use Topics   Alcohol use: Yes   Drug use: Never    Home Medications Prior to Admission medications   Medication Sig Start Date End Date Taking? Authorizing Provider  benzonatate (TESSALON) 100 MG capsule Take 2 capsules (200 mg total) by mouth every 8 (eight) hours. 10/21/21  Yes Tilden Fossa, MD   ondansetron (ZOFRAN ODT) 4 MG disintegrating tablet Take 1 tablet (4 mg total) by mouth every 8 (eight) hours as needed for nausea or vomiting. 10/15/21   Sponseller, Lupe Carney R, PA-C  sucralfate (CARAFATE) 1 g tablet Take 1 tablet (1 g total) by mouth 4 (four) times daily as needed. 08/22/21   Sabas Sous, MD    Allergies    Kiwi extract, Prednisone, Aspirin, and Eletriptan  Review of Systems   Review of Systems  Respiratory:  Positive for shortness of breath.   All other systems reviewed and are negative.  Physical Exam Updated Vital Signs BP (!) 148/122   Pulse (!) 57   Temp 98.3 F (36.8 C) (Oral)   Resp 16   SpO2 99%   Physical Exam Vitals and nursing note reviewed.  Constitutional:      Appearance: She is well-developed.  HENT:     Head: Normocephalic and atraumatic.  Cardiovascular:     Rate and Rhythm: Normal rate and regular rhythm.     Heart sounds: No murmur heard. Pulmonary:     Effort: Pulmonary effort is normal. No respiratory distress.     Breath sounds: Normal breath sounds.  Abdominal:     Palpations: Abdomen is soft.     Tenderness: There is no abdominal tenderness. There is no guarding or rebound.  Musculoskeletal:        General: No tenderness.     Comments: Trace non-pitting edema  to bilateral lower extremities, left greater than right.  Skin:    General: Skin is warm and dry.  Neurological:     Mental Status: She is alert and oriented to person, place, and time.  Psychiatric:        Behavior: Behavior normal.    ED Results / Procedures / Treatments   Labs (all labs ordered are listed, but only abnormal results are displayed) Labs Reviewed  BASIC METABOLIC PANEL - Abnormal; Notable for the following components:      Result Value   Potassium 3.4 (*)    Calcium 8.5 (*)    All other components within normal limits  CBC WITH DIFFERENTIAL/PLATELET - Abnormal; Notable for the following components:   RBC 3.75 (*)    Hemoglobin 8.3 (*)    HCT  28.4 (*)    MCV 75.7 (*)    MCH 22.1 (*)    MCHC 29.2 (*)    RDW 22.2 (*)    Abs Immature Granulocytes 0.09 (*)    All other components within normal limits  I-STAT BETA HCG BLOOD, ED ([facility], [facility], [facility] ONLY) - Abnormal; Notable for the following components:   I-stat hCG, quantitative 7.7 (*)    All other components within normal limits  D-DIMER, QUANTITATIVE  TROPONIN I (HIGH SENSITIVITY)    EKG EKG Interpretation  Date/Time:  Friday October 21 2021 06:33:22 EDT Ventricular Rate:  67 PR Interval:  142 QRS Duration: 96 QT Interval:  416 QTC Calculation: 440 R Axis:   61 Text Interpretation: Sinus rhythm Low voltage, precordial leads RSR' in V1 or V2, right VCD or RVH No previous tracing Confirmed by Tilden Fossa [phone]) on 10/21/2021 7:00:26 AM  Radiology DG Chest Port 1 View  Result Date: 10/21/2021 CLINICAL DATA:  45 year old female with history of weakness, body aches, cough and shortness of breath. Tested positive for flu 5 days ago. EXAM: PORTABLE CHEST 1 VIEW COMPARISON:  Chest x-ray 10/15/2021. FINDINGS: Lung volumes are low. No consolidative airspace disease. No pleural effusions. No pneumothorax. No pulmonary nodule or mass noted. Pulmonary vasculature and the cardiomediastinal silhouette are within normal limits. IMPRESSION: 1. Very low lung volumes without radiographic evidence of acute cardiopulmonary disease. Electronically Signed   By: Trudie Reed M.D.   On: 10/21/2021 06:11    Procedures Procedures   Medications Ordered in ED Medications  dexamethasone (DECADRON) tablet 10 mg (has no administration in time range)  albuterol (VENTOLIN HFA) 108 (90 Base) MCG/ACT inhaler 2 puff (has no administration in time range)  albuterol (PROVENTIL) (2.5 MG/3ML) 0.083% nebulizer solution 2.5 mg (2.5 mg Nebulization Given 10/21/21 0632)  oxymetazoline (AFRIN) 0.05 % nasal spray 1 spray (1 spray Each Nare Given 10/21/21 0617)  benzonatate (TESSALON) capsule 100 mg (100 mg Oral  Given 10/21/21 0617)  guaiFENesin (ROBITUSSIN) 100 MG/5ML liquid 10 mL (10 mLs Oral Given 10/21/21 0617)    ED Course  I have reviewed the triage vital signs and the nursing notes.  Pertinent labs & imaging results that were available during my care of the patient were reviewed by me and considered in my medical decision making (see chart for details).    MDM Rules/Calculators/A&P                          patient with recent diagnosis of influenza A here for evaluation of increase shortness of breath, chest pressure today. She does have asymmetric lower extremity edema. No respiratory distress on examination. Will provide  trial of albuterol to assess for improvement in her symptoms. Also plan to check EKG, D dimer to rule out PE given her asymmetric edema.   EKG is without acute ischemic changes. CBC with stable anemia. BMP without significant electrolyte abnormalities. Chest x-ray with low lung volumes, no complicating features such as pneumonia on examination or imaging. D dimer is negative, doubt PE. Troponin is negative, current presentation is not consistent with ACS. She is feeling partially improved after albuterol treatment and Afrin. Will treat with one-time dose of Decadron for possible element of reactive airway as well. Will discharge home with albuterol inhaler to use as needed as well as ongoing Afrin, test on. Discussed home care for influenza as well as return precautions. Final Clinical Impression(s) / ED Diagnoses Final diagnoses:  Influenza A    Rx / DC Orders ED Discharge Orders          Ordered    benzonatate (TESSALON) 100 MG capsule  Every 8 hours        10/21/21 0733             Tilden Fossa, MD 10/21/21 3875    Tilden Fossa, MD 10/21/21 [phone]

## 2021-12-18 ENCOUNTER — Emergency Department (HOSPITAL_COMMUNITY)
Admission: EM | Admit: 2021-12-18 | Discharge: 2021-12-18 | Disposition: A | Discharge status: Home / Self Care | Payer: BC Managed Care – PPO | Attending: Emergency Medicine | Admitting: Emergency Medicine

## 2021-12-18 ENCOUNTER — Encounter (HOSPITAL_COMMUNITY): Payer: Self-pay

## 2021-12-18 ENCOUNTER — Other Ambulatory Visit: Payer: Self-pay

## 2021-12-18 DIAGNOSIS — N939 Abnormal uterine and vaginal bleeding, unspecified: Secondary | ICD-10-CM | POA: Diagnosis not present
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
## 2021-12-18 NOTE — ED Notes (Signed)
Pt states understanding of dc instructions, importance of follow up.  Pt denies questions or concerns upon dc. Pt declined wheelchair assistance upon dc. Pt ambulated out of ed w/ steady gait. No belongings left in room upon dc.  

## 2021-12-18 NOTE — ED Provider Notes (Signed)
[facility]-EMERGENCY DEPT Provider Note   CSN: [id number] Arrival date & time: 12/18/21  1322     History  Chief Complaint  Patient presents with   Foreign Body in Vagina    Karen Marshall is a 46 y.o. female.  HPI She presents for evaluation of suspected foreign body in vagina, condom.  This occurred 2 nights ago, following her first episode of sexual intercourse.  Since then she has noticed some intermittent vaginal bleeding.  Her last menses was about 7 days ago when she bled as usual for a few days.  To treat herself she tried douching twice, and is not sure if anything came out.  She denies abdominal pain, weakness or dizziness.  She denies any traumatic injury from the first episode of sexual intercourse.  There are no other known active modifying factors.    Home Medications Prior to Admission medications   Medication Sig Start Date End Date Taking? Authorizing Provider  benzonatate (TESSALON) 100 MG capsule Take 2 capsules (200 mg total) by mouth every 8 (eight) hours. 10/21/21   Tilden Fossa, MD  ondansetron (ZOFRAN ODT) 4 MG disintegrating tablet Take 1 tablet (4 mg total) by mouth every 8 (eight) hours as needed for nausea or vomiting. 10/15/21   Sponseller, Lupe Carney R, PA-C  sucralfate (CARAFATE) 1 g tablet Take 1 tablet (1 g total) by mouth 4 (four) times daily as needed. 08/22/21   Sabas Sous, MD      Allergies    Kiwi extract, Prednisone, Aspirin, and Eletriptan    Review of Systems   Review of Systems  All other systems reviewed and are negative.  Physical Exam Updated Vital Signs BP (!) 133/91 (BP Location: Left Arm)    Pulse 74    Temp 98 F (36.7 C) (Oral)    Resp 18    SpO2 100%  Physical Exam Vitals and nursing note reviewed.  Constitutional:      General: She is not in acute distress.    Appearance: She is well-developed. She is obese. She is not ill-appearing or diaphoretic.  HENT:     Head: Normocephalic and atraumatic.      Right Ear: External ear normal.     Left Ear: External ear normal.  Eyes:     Conjunctiva/sclera: Conjunctivae normal.     Pupils: Pupils are equal, round, and reactive to light.  Neck:     Trachea: Phonation normal.  Cardiovascular:     Rate and Rhythm: Normal rate.  Pulmonary:     Effort: Pulmonary effort is normal.  Abdominal:     General: There is no distension.     Palpations: Abdomen is soft.     Tenderness: There is no abdominal tenderness.  Genitourinary:    Comments: Normal external female genitalia.  Vaginal orifice is remarkable for a small amount of mucus present but no foreign body.  She was examined using the pediatric speculum.  Visualization of the posterior vagina was good.  There was a small amount of blood from the cervical os.  Cervical os otherwise looked normal.  Bimanual examination was deferred. Musculoskeletal:        General: Normal range of motion.     Cervical back: Normal range of motion and neck supple.  Skin:    General: Skin is warm and dry.  Neurological:     Mental Status: She is alert and oriented to person, place, and time.     Cranial Nerves: No cranial nerve  deficit.     Sensory: No sensory deficit.     Motor: No abnormal muscle tone.     Coordination: Coordination normal.  Psychiatric:        Behavior: Behavior normal.        Thought Content: Thought content normal.        Judgment: Judgment normal.    ED Results / Procedures / Treatments   Labs (all labs ordered are listed, but only abnormal results are displayed) Labs Reviewed - No data to display  EKG None  Radiology No results found.  Procedures Procedures    Medications Ordered in ED Medications - No data to display  ED Course/ Medical Decision Making/ A&P                           Medical Decision Making   Patient Vitals for the past 24 hrs:  BP Temp Temp src Pulse Resp SpO2  12/18/21 1352 (!) 133/91 -- -- 74 18 100 %  12/18/21 1336 (!) 140/102 98 F (36.7  C) Oral 80 18 99 %    3:31 PM Reevaluation with update and discussion with no further complaints.  Vital signs reassuring.  Findings discussed with the patient and all questions were answered. After initial assessment and treatment, an updated evaluation reveals she is comfortable. Illness risk, progressive bleeding or pain, discussed. Mancel Bale    Medical Decision Making: Summary: Patient presented for concern of potential vaginal foreign body.  She has had some vaginal bleeding since then.  This occurred after her first episode of penile penetrative sexual intercourse.  Critical Interventions-pelvic examination; to evaluate  Chief Complaint  Patient presents with   Foreign Body in Vagina    and assess for illness characterized as Acute and Self-Limited   After These Interventions, the Patient was reevaluated and was found stable for discharge.  No vaginal foreign body.  No clinical concern for acute vaginal disorder including injury, foreign body, or acute pelvic pathology.  This patient is Presenting for Evaluation of suspected vaginal foreign body, which does require a range of treatment options, and is a complaint that involves a moderate risk of morbidity and mortality. The Differential Diagnoses include vaginal foreign body, vaginal trauma due to first sexual encounter with penile penetration, abnormal vaginal bleeding.  I did not require additional Historical Information from anyone, as the patient is lucid.   CRITICAL CARE-no Performed by: Mancel Bale  Nursing Notes Reviewed/ Care Coordinated Applicable Imaging Reviewed Interpretation of Laboratory Data incorporated into ED treatment  The patient appears reasonably screened and/or stabilized for discharge and I doubt any other medical condition or other [facility] requiring further screening, evaluation, or treatment in the ED at this time prior to discharge.  Plan: Home Medications-OTC as needed; Home Treatments-rest, fluids,  gradual increase activity including sexual intercourse; return here if the recommended treatment, does not improve the symptoms; Recommended follow up-PCP or GYN follow-up as needed.            Final Clinical Impression(s) / ED Diagnoses Final diagnoses:  Vagina bleeding    Rx / DC Orders ED Discharge Orders     None         Mancel Bale, MD 12/18/21 [phone]

## 2021-12-18 NOTE — Discharge Instructions (Signed)
There was no foreign body found in the vagina.  The vaginal bleeding you are having is likely physiologic and not serious.  If you continue to have vaginal bleeding, drainage from the vagina, or foreign body sensation, please see your doctor for further evaluation and treatment.

## 2022-01-29 ENCOUNTER — Emergency Department (HOSPITAL_COMMUNITY)
Admission: EM | Admit: 2022-01-29 | Discharge: 2022-01-29 | Disposition: A | Discharge status: Home / Self Care | Payer: BC Managed Care – PPO | Attending: Emergency Medicine | Admitting: Emergency Medicine

## 2022-01-29 ENCOUNTER — Emergency Department (HOSPITAL_COMMUNITY): Payer: BC Managed Care – PPO

## 2022-01-29 ENCOUNTER — Other Ambulatory Visit: Payer: Self-pay

## 2022-01-29 ENCOUNTER — Encounter (HOSPITAL_COMMUNITY): Payer: Self-pay | Admitting: *Deleted

## 2022-01-29 DIAGNOSIS — Z79899 Other long term (current) drug therapy: Secondary | ICD-10-CM | POA: Insufficient documentation

## 2022-01-29 DIAGNOSIS — E039 Hypothyroidism, unspecified: Secondary | ICD-10-CM | POA: Insufficient documentation

## 2022-01-29 DIAGNOSIS — Z20822 Contact with and (suspected) exposure to covid-19: Secondary | ICD-10-CM | POA: Diagnosis not present

## 2022-01-29 DIAGNOSIS — F419 Anxiety disorder, unspecified: Secondary | ICD-10-CM | POA: Diagnosis not present

## 2022-01-29 DIAGNOSIS — E860 Dehydration: Secondary | ICD-10-CM | POA: Diagnosis not present

## 2022-01-29 DIAGNOSIS — R079 Chest pain, unspecified: Secondary | ICD-10-CM | POA: Insufficient documentation

## 2022-01-29 DIAGNOSIS — R0602 Shortness of breath: Secondary | ICD-10-CM | POA: Diagnosis not present

## 2022-01-29 DIAGNOSIS — J45909 Unspecified asthma, uncomplicated: Secondary | ICD-10-CM | POA: Insufficient documentation

## 2022-01-29 DIAGNOSIS — R110 Nausea: Secondary | ICD-10-CM | POA: Insufficient documentation

## 2022-01-29 DIAGNOSIS — D649 Anemia, unspecified: Secondary | ICD-10-CM

## 2022-01-29 DIAGNOSIS — R059 Cough, unspecified: Secondary | ICD-10-CM | POA: Diagnosis present

## 2022-01-29 DIAGNOSIS — D500 Iron deficiency anemia secondary to blood loss (chronic): Secondary | ICD-10-CM | POA: Insufficient documentation

## 2022-01-29 DIAGNOSIS — J069 Acute upper respiratory infection, unspecified: Secondary | ICD-10-CM | POA: Diagnosis not present

## 2022-01-29 HISTORY — DX: Unspecified asthma, uncomplicated: J45.909

## 2022-01-29 HISTORY — DX: Anemia, unspecified: D64.9

## 2022-01-29 HISTORY — DX: Disorder of thyroid, unspecified: E07.9

## 2022-01-29 LAB — I-STAT BETA HCG BLOOD, ED (MC, WL, AP ONLY): I-stat hCG, quantitative: <5 m[IU]/mL (ref <5)

## 2022-01-29 LAB — BASIC METABOLIC PANEL
Anion gap: 12 (ref 5–15)
BUN: 13 mg/dL (ref 6–20)
CO2: 24 mmol/L (ref 22–32)
Calcium: 8.6 mg/dL — ABNORMAL LOW (ref 8.9–10.3)
Chloride: 103 mmol/L (ref 98–111)
Creatinine, Ser: 0.63 mg/dL (ref 0.44–1.00)
GFR, Estimated: >60 mL/min (ref >60)
Glucose, Bld: 88 mg/dL (ref 70–99)
Potassium: 4 mmol/L (ref 3.5–5.1)
Sodium: 139 mmol/L (ref 135–145)

## 2022-01-29 LAB — CBC
HCT: 30.6 % — ABNORMAL LOW (ref 36.0–46.0)
Hemoglobin: 8.9 g/dL — ABNORMAL LOW (ref 12.0–15.0)
MCH: 23.3 pg — ABNORMAL LOW (ref 26.0–34.0)
MCHC: 29.1 g/dL — ABNORMAL LOW (ref 30.0–36.0)
MCV: 80.1 fL (ref 80.0–100.0)
Platelets: 250 10*3/uL (ref 150–400)
RBC: 3.82 MIL/uL — ABNORMAL LOW (ref 3.87–5.11)
RDW: 22 % — ABNORMAL HIGH (ref 11.5–15.5)
WBC: 4.7 10*3/uL (ref 4.0–10.5)
nRBC: 0 % (ref 0.0–0.2)

## 2022-01-29 LAB — RESP PANEL BY RT-PCR (FLU A&B, COVID) ARPGX2
Influenza A by PCR: NEGATIVE
Influenza B by PCR: NEGATIVE
SARS Coronavirus 2 by RT PCR: NEGATIVE

## 2022-01-29 LAB — TROPONIN I (HIGH SENSITIVITY)
Troponin I (High Sensitivity): 3 ng/L (ref <18)
Troponin I (High Sensitivity): 4 ng/L (ref <18)

## 2022-01-29 MED ORDER — ONDANSETRON HCL 4 MG/2ML IJ SOLN
4.0000 mg | Freq: Once | INTRAMUSCULAR | Status: AC
  Administered 2022-01-29: 4 mg via INTRAVENOUS
  Filled 2022-01-29: qty 2

## 2022-01-29 MED ORDER — AEROCHAMBER Z-STAT PLUS/MEDIUM MISC
1.0000 | Freq: Once | Status: AC
  Administered 2022-01-29: 1
  Filled 2022-01-29: qty 1

## 2022-01-29 MED ORDER — ALBUTEROL SULFATE HFA 108 (90 BASE) MCG/ACT IN AERS
2.0000 | INHALATION_SPRAY | Freq: Once | RESPIRATORY_TRACT | Status: AC
  Administered 2022-01-29: 2 via RESPIRATORY_TRACT
  Filled 2022-01-29: qty 6.7

## 2022-01-29 MED ORDER — SODIUM CHLORIDE 0.9 % IV BOLUS
1000.0000 mL | Freq: Once | INTRAVENOUS | Status: AC
  Administered 2022-01-29: 1000 mL via INTRAVENOUS

## 2022-01-29 MED ORDER — IOHEXOL 350 MG/ML SOLN
80.0000 mL | Freq: Once | INTRAVENOUS | Status: AC | PRN
  Administered 2022-01-29: 80 mL via INTRAVENOUS

## 2022-01-29 MED ORDER — ACETAMINOPHEN 500 MG PO TABS
1000.0000 mg | ORAL_TABLET | Freq: Once | ORAL | Status: AC
  Administered 2022-01-29: 1000 mg via ORAL
  Filled 2022-01-29: qty 2

## 2022-01-29 MED ORDER — HYDROCOD POLI-CHLORPHE POLI ER 10-8 MG/5ML PO SUER: 5.0000 mL | Freq: Two times a day (BID) | ORAL | 0 refills | Status: AC | PRN

## 2022-01-29 MED ORDER — SODIUM CHLORIDE (PF) 0.9 % IJ SOLN
INTRAMUSCULAR | Status: AC
  Filled 2022-01-29: qty 50

## 2022-01-29 MED ORDER — LORAZEPAM 1 MG PO TABS: 1.0000 mg | ORAL_TABLET | Freq: Three times a day (TID) | ORAL | 0 refills | Status: AC | PRN

## 2022-01-29 MED ORDER — SALINE SPRAY 0.65 % NA SOLN
1.0000 | Freq: Once | NASAL | Status: AC
  Administered 2022-01-29: 1 via NASAL
  Filled 2022-01-29: qty 44

## 2022-01-29 NOTE — ED Triage Notes (Signed)
Pt says she has felt lightheaded , nausea, central chest pain, short of breath, and right sided nosebleed tonight.

## 2022-01-29 NOTE — ED Provider Notes (Signed)
[facility]-EMERGENCY DEPT Provider Note   CSN: [id number] Arrival date & time: 01/29/22  2774     History  Chief Complaint  Patient presents with   Dizziness    Heather R Engelmann is a 46 y.o. female.  Pt is a 46 yo wf with a hx of asthma, hypothyroidism, and anemia.  She presents to the ED today with dizziness, nausea, sob, cp, and cough.  No fevers.  Sx started a few hrs ago.  She works night shift at [employer], so was awake when sx started.  She said her right nare started to bleed after coughing hard.  It has now stopped.  She denies any known sick contacts.      Home Medications Prior to Admission medications   Medication Sig Start Date End Date Taking? Authorizing Provider  acetaminophen (TYLENOL) 500 MG tablet Take 1,000 mg by mouth every 6 (six) hours as needed for mild pain.   Yes [provider]  albuterol (VENTOLIN HFA) 108 (90 Base) MCG/ACT inhaler Inhale 2 puffs into the lungs every 6 (six) hours as needed for wheezing or shortness of breath. 10/10/21  Yes [provider]  chlorpheniramine-HYDROcodone (TUSSIONEX PENNKINETIC ER) 10-8 MG/5ML Take 5 mLs by mouth every 12 (twelve) hours as needed for cough. 01/29/22  Yes Jacalyn Lefevre, MD  ferrous gluconate (FERGON) 324 MG tablet Take 324 mg by mouth daily. 01/11/21  Yes [provider]  levothyroxine (SYNTHROID) 75 MCG tablet Take 75 mcg by mouth daily.   Yes [provider]  LORazepam (ATIVAN) 1 MG tablet Take 1 tablet (1 mg total) by mouth every 8 (eight) hours as needed for anxiety. 01/29/22  Yes Jacalyn Lefevre, MD  Multiple Vitamin [facility] ESSENTIAL ONE DAILY) TABS Take 1 tablet by mouth daily.   Yes [provider]  ondansetron (ZOFRAN ODT) 4 MG disintegrating tablet Take 1 tablet (4 mg total) by mouth every 8 (eight) hours as needed for nausea or vomiting. 10/15/21  Yes Sponseller, Rebekah R, PA-C  sucralfate (CARAFATE) 1 g tablet Take 1 tablet (1 g total) by  mouth 4 (four) times daily as needed. Patient taking differently: Take 1 g by mouth 4 (four) times daily as needed (abdominal pain). 08/22/21  Yes Sabas Sous, MD  vitamin C (ASCORBIC ACID) 500 MG tablet Take 500 mg by mouth daily.   Yes [provider]      Allergies    Kiwi extract, Prednisone, Other, Aspirin, and Eletriptan    Review of Systems   Review of Systems  HENT:  Positive for nosebleeds.   Respiratory:  Positive for shortness of breath.   Cardiovascular:  Positive for chest pain.  Gastrointestinal:  Positive for nausea.  Neurological:  Positive for weakness.  All other systems reviewed and are negative.  Physical Exam Updated Vital Signs BP (!) 160/99    Pulse 77    Temp 98.3 F (36.8 C) (Oral)    Resp (!) 22    Ht 5\' 5"  (1.651 m)    Wt 102.1 kg    LMP 01/13/2022    SpO2 98%    BMI 37.44 kg/m  Physical Exam Vitals and nursing note reviewed.  Constitutional:      Appearance: Normal appearance. She is obese.  HENT:     Head: Normocephalic and atraumatic.     Right Ear: External ear normal.     Left Ear: External ear normal.     Nose:     Comments: Right nare with  dried blood.  No active bleeding.    Mouth/Throat:     Mouth: Mucous membranes are dry.  Eyes:     Extraocular Movements: Extraocular movements intact.     Conjunctiva/sclera: Conjunctivae normal.     Pupils: Pupils are equal, round, and reactive to light.  Cardiovascular:     Rate and Rhythm: Normal rate and regular rhythm.     Pulses: Normal pulses.     Heart sounds: Normal heart sounds.  Pulmonary:     Effort: Pulmonary effort is normal.     Breath sounds: Normal breath sounds.  Abdominal:     General: Abdomen is flat. Bowel sounds are normal.     Palpations: Abdomen is soft.  Musculoskeletal:        General: Normal range of motion.     Cervical back: Normal range of motion and neck supple.  Skin:    General: Skin is warm.     Capillary Refill: Capillary refill takes less than 2  seconds.  Neurological:     General: No focal deficit present.     Mental Status: She is alert and oriented to person, place, and time.  Psychiatric:        Mood and Affect: Mood normal.        Behavior: Behavior normal.        Thought Content: Thought content normal.        Judgment: Judgment normal.   ED Results / Procedures / Treatments   Labs (all labs ordered are listed, but only abnormal results are displayed) Labs Reviewed  BASIC METABOLIC PANEL - Abnormal; Notable for the following components:      Result Value   Calcium 8.6 (*)    All other components within normal limits  CBC - Abnormal; Notable for the following components:   RBC 3.82 (*)    Hemoglobin 8.9 (*)    HCT 30.6 (*)    MCH 23.3 (*)    MCHC 29.1 (*)    RDW 22.0 (*)    All other components within normal limits  RESP PANEL BY RT-PCR (FLU A&B, COVID) ARPGX2  I-STAT BETA HCG BLOOD, ED ([facility], [facility], [facility] ONLY)  TROPONIN I (HIGH SENSITIVITY)  TROPONIN I (HIGH SENSITIVITY)    EKG EKG Interpretation  Date/Time:  Sunday January 29 2022 06:36:09 EST Ventricular Rate:  74 PR Interval:  156 QRS Duration: 123 QT Interval:  425 QTC Calculation: 472 R Axis:   43 Text Interpretation: Sinus rhythm Probable left atrial enlargement Nonspecific intraventricular conduction delay Probable anteroseptal infarct, old No significant change since last tracing Confirmed by Jacalyn LefevreHaviland, Chelesa Weingartner [phone]([id number]) on 01/29/2022 6:59:01 AM  Radiology DG Chest 2 View  Result Date: 01/29/2022 CLINICAL DATA:  46 year old female with history of chest pain and shortness of breath. EXAM: CHEST - 2 VIEW COMPARISON:  Chest x-ray 10/21/2021. FINDINGS: Lung volumes are low. No consolidative airspace disease. No pleural effusions. No pneumothorax. No pulmonary nodule or mass noted. Pulmonary vasculature and the cardiomediastinal silhouette are within normal limits. IMPRESSION: 1. Low lung volumes without radiographic evidence of acute cardiopulmonary disease.  Electronically Signed   By: Trudie Reedaniel  Entrikin M.D.   On: 01/29/2022 07:46   CT Angio Chest PE W and/or Wo Contrast  Result Date: 01/29/2022 CLINICAL DATA:  PE suspected EXAM: CT ANGIOGRAPHY CHEST WITH CONTRAST TECHNIQUE: Multidetector CT imaging of the chest was performed using the standard protocol during bolus administration of intravenous contrast. Multiplanar CT image reconstructions and MIPs were obtained to evaluate the vascular anatomy.  RADIATION DOSE REDUCTION: This exam was performed according to the departmental dose-optimization program which includes automated exposure control, adjustment of the mA and/or kV according to patient size and/or use of iterative reconstruction technique. CONTRAST:  17mL OMNIPAQUE IOHEXOL 350 MG/ML SOLN COMPARISON:  None. FINDINGS: Cardiovascular: Satisfactory opacification of the pulmonary arteries to the segmental level. No evidence of pulmonary embolism. Normal heart size. No pericardial effusion. Mediastinum/Nodes: No enlarged mediastinal, hilar, or axillary lymph nodes. Small hiatal hernia. Thyroid gland, trachea, and esophagus demonstrate no significant findings. Lungs/Pleura: Lungs are clear. No pleural effusion or pneumothorax. Upper Abdomen: No acute abnormality.  Hepatic steatosis. Musculoskeletal: No chest wall abnormality. No acute osseous findings. Review of the MIP images confirms the above findings. IMPRESSION: 1. Negative examination for pulmonary embolism. 2. Small hiatal hernia. 3. Hepatic steatosis. Electronically Signed   By: Jearld Lesch M.D.   On: 01/29/2022 10:04    Procedures Procedures    Medications Ordered in ED Medications  sodium chloride (PF) 0.9 % injection (has no administration in time range)  sodium chloride 0.9 % bolus 1,000 mL (0 mLs Intravenous Stopped 01/29/22 0842)  ondansetron (ZOFRAN) injection 4 mg (4 mg Intravenous Given 01/29/22 0724)  acetaminophen (TYLENOL) tablet 1,000 mg (1,000 mg Oral Given 01/29/22 0723)  albuterol  (VENTOLIN HFA) 108 (90 Base) MCG/ACT inhaler 2 puff (2 puffs Inhalation Given 01/29/22 0722)  aerochamber Z-Stat Plus/medium 1 each (1 each Other Given 01/29/22 0723)  sodium chloride (OCEAN) 0.65 % nasal spray 1 spray (1 spray Each Nare Given 01/29/22 0839)  iohexol (OMNIPAQUE) 350 MG/ML injection 80 mL (80 mLs Intravenous Contrast Given 01/29/22 1740)    ED Course/ Medical Decision Making/ A&P                           Medical Decision Making Amount and/or Complexity of Data Reviewed Labs: ordered. Radiology: ordered.  Risk OTC drugs. Prescription drug management.   This patient presents to the ED for concern of cp, sob, , this involves an extensive number of treatment options, and is a complaint that carries with it a high risk of complications and morbidity.  The differential diagnosis includes CAD, pneumonia, uri, covid/flu   Co morbidities that complicate the patient evaluation  Asthma, hypothyroidism, anemia   Additional history obtained:  Additional history obtained from epic chart review    Lab Tests:  I Ordered, and personally interpreted labs.  The pertinent results include:  CBC shows chronic anemia, BMP nl, trop nl, covid/flu neg, preg neg   Imaging Studies ordered:  I ordered imaging studies including cxr and cta to r/o pe  I independently visualized and interpreted imaging which showed nothing acute I agree with the radiologist interpretation   Cardiac Monitoring:  The patient was maintained on a cardiac monitor.  I personally viewed and interpreted the cardiac monitored which showed an underlying rhythm of: nsr   Medicines ordered and prescription drug management:  I ordered medication including albuterol, tylenol, zofran  for sob, pain, nausea  Reevaluation of the patient after these medicines showed that the patient improved I have reviewed the patients home medicines and have made adjustments as needed   Test Considered:  CTA chest as pt was  sob and O2 sat dropped to 86.   Critical Interventions:  Oxygen placed Oxygen was later removed and O2 sat 97% on RA.    Problem List / ED Course:  Sob and cp:  cardiac source ruled out.  No PE.  No pna.   Reevaluation:  After the interventions noted above, I reevaluated the patient and found that they have :improved   Social Determinants of Health:  Lives with her elderly mother.  No one can pick her up.   Dispostion:  After consideration of the diagnostic results and the patients response to treatment, I feel that the patent would benefit from discharge with outpatient f/u.          Final Clinical Impression(s) / ED Diagnoses Final diagnoses:  Viral upper respiratory tract infection  Chronic anemia  Dehydration  Anxiety    Rx / DC Orders ED Discharge Orders          Ordered    LORazepam (ATIVAN) 1 MG tablet  Every 8 hours PRN        01/29/22 1025    chlorpheniramine-HYDROcodone (TUSSIONEX PENNKINETIC ER) 10-8 MG/5ML  Every 12 hours PRN        01/29/22 1026              Jacalyn Lefevre, MD 01/29/22 1045

## 2022-01-29 NOTE — ED Notes (Signed)
Placed on 3 L [place], pt was 86% RA, reported SOB.

## 2022-01-29 NOTE — ED Notes (Signed)
Pt ambulated to the bathroom with tech assistance

## 2022-02-19 ENCOUNTER — Emergency Department (HOSPITAL_BASED_OUTPATIENT_CLINIC_OR_DEPARTMENT_OTHER)
Admission: EM | Admit: 2022-02-19 | Discharge: 2022-02-19 | Disposition: A | Discharge status: Home / Self Care | Payer: BC Managed Care – PPO | Attending: Emergency Medicine | Admitting: Emergency Medicine

## 2022-02-19 ENCOUNTER — Other Ambulatory Visit: Payer: Self-pay

## 2022-02-19 ENCOUNTER — Encounter (HOSPITAL_BASED_OUTPATIENT_CLINIC_OR_DEPARTMENT_OTHER): Payer: Self-pay | Admitting: Emergency Medicine

## 2022-02-19 ENCOUNTER — Emergency Department (HOSPITAL_BASED_OUTPATIENT_CLINIC_OR_DEPARTMENT_OTHER): Payer: BC Managed Care – PPO

## 2022-02-19 DIAGNOSIS — Z20822 Contact with and (suspected) exposure to covid-19: Secondary | ICD-10-CM | POA: Diagnosis not present

## 2022-02-19 DIAGNOSIS — R42 Dizziness and giddiness: Secondary | ICD-10-CM | POA: Insufficient documentation

## 2022-02-19 DIAGNOSIS — F10239 Alcohol dependence with withdrawal, unspecified: Secondary | ICD-10-CM | POA: Insufficient documentation

## 2022-02-19 DIAGNOSIS — Z79899 Other long term (current) drug therapy: Secondary | ICD-10-CM | POA: Insufficient documentation

## 2022-02-19 DIAGNOSIS — F10930 Alcohol use, unspecified with withdrawal, uncomplicated: Secondary | ICD-10-CM

## 2022-02-19 DIAGNOSIS — R059 Cough, unspecified: Secondary | ICD-10-CM | POA: Diagnosis present

## 2022-02-19 LAB — COMPREHENSIVE METABOLIC PANEL
ALT: 34 U/L (ref 0–44)
AST: 93 U/L — ABNORMAL HIGH (ref 15–41)
Albumin: 4.3 g/dL (ref 3.5–5.0)
Alkaline Phosphatase: 76 U/L (ref 38–126)
Anion gap: 15 (ref 5–15)
BUN: 8 mg/dL (ref 6–20)
CO2: 28 mmol/L (ref 22–32)
Calcium: 8.9 mg/dL (ref 8.9–10.3)
Chloride: 96 mmol/L — ABNORMAL LOW (ref 98–111)
Creatinine, Ser: 0.78 mg/dL (ref 0.44–1.00)
GFR, Estimated: >60 mL/min (ref >60)
Glucose, Bld: 99 mg/dL (ref 70–99)
Potassium: 3.5 mmol/L (ref 3.5–5.1)
Sodium: 139 mmol/L (ref 135–145)
Total Bilirubin: 1.3 mg/dL — ABNORMAL HIGH (ref 0.3–1.2)
Total Protein: 7.9 g/dL (ref 6.5–8.1)

## 2022-02-19 LAB — CBC WITH DIFFERENTIAL/PLATELET
Abs Immature Granulocytes: 0.05 10*3/uL (ref 0.00–0.07)
Basophils Absolute: 0 10*3/uL (ref 0.0–0.1)
Basophils Relative: 1 %
Eosinophils Absolute: 0 10*3/uL (ref 0.0–0.5)
Eosinophils Relative: 1 %
HCT: 30.5 % — ABNORMAL LOW (ref 36.0–46.0)
Hemoglobin: 9.1 g/dL — ABNORMAL LOW (ref 12.0–15.0)
Immature Granulocytes: 1 %
Lymphocytes Relative: 30 %
Lymphs Abs: 1.1 10*3/uL (ref 0.7–4.0)
MCH: 24 pg — ABNORMAL LOW (ref 26.0–34.0)
MCHC: 29.8 g/dL — ABNORMAL LOW (ref 30.0–36.0)
MCV: 80.5 fL (ref 80.0–100.0)
Monocytes Absolute: 0.7 10*3/uL (ref 0.1–1.0)
Monocytes Relative: 17 %
Neutro Abs: 2 10*3/uL (ref 1.7–7.7)
Neutrophils Relative %: 50 %
Platelets: 224 10*3/uL (ref 150–400)
RBC: 3.79 MIL/uL — ABNORMAL LOW (ref 3.87–5.11)
RDW: 24.9 % — ABNORMAL HIGH (ref 11.5–15.5)
Smear Review: NORMAL
WBC: 3.9 10*3/uL — ABNORMAL LOW (ref 4.0–10.5)
nRBC: 0 % (ref 0.0–0.2)

## 2022-02-19 LAB — TROPONIN I (HIGH SENSITIVITY): Troponin I (High Sensitivity): 3 ng/L (ref <18)

## 2022-02-19 LAB — MAGNESIUM: Magnesium: 1.1 mg/dL — ABNORMAL LOW (ref 1.7–2.4)

## 2022-02-19 LAB — RESP PANEL BY RT-PCR (FLU A&B, COVID) ARPGX2
Influenza A by PCR: NEGATIVE
Influenza B by PCR: NEGATIVE
SARS Coronavirus 2 by RT PCR: NEGATIVE

## 2022-02-19 LAB — ETHANOL: Alcohol, Ethyl (B): <10 mg/dL (ref <10)

## 2022-02-19 LAB — CBG MONITORING, ED: Glucose-Capillary: 95 mg/dL (ref 70–99)

## 2022-02-19 MED ORDER — MAGNESIUM OXIDE -MG SUPPLEMENT 200 MG PO TABS
200.0000 mg | ORAL_TABLET | Freq: Two times a day (BID) | ORAL | 0 refills | Status: AC
Start: 2022-02-19 — End: 2022-02-24

## 2022-02-19 MED ORDER — LORAZEPAM 2 MG/ML IJ SOLN
1.0000 mg | Freq: Once | INTRAMUSCULAR | Status: AC
  Administered 2022-02-19: 1 mg via INTRAVENOUS
  Filled 2022-02-19: qty 1

## 2022-02-19 MED ORDER — MAGNESIUM SULFATE 2 GM/50ML IV SOLN
2.0000 g | Freq: Once | INTRAVENOUS | Status: AC
  Administered 2022-02-19: 2 g via INTRAVENOUS
  Filled 2022-02-19: qty 50

## 2022-02-19 MED ORDER — CHLORDIAZEPOXIDE HCL 25 MG PO CAPS
50.0000 mg | ORAL_CAPSULE | Freq: Once | ORAL | Status: AC
  Administered 2022-02-19: 50 mg via ORAL
  Filled 2022-02-19: qty 2

## 2022-02-19 MED ORDER — METOCLOPRAMIDE HCL 5 MG/ML IJ SOLN
10.0000 mg | Freq: Once | INTRAMUSCULAR | Status: AC
  Administered 2022-02-19: 10 mg via INTRAVENOUS
  Filled 2022-02-19: qty 2

## 2022-02-19 MED ORDER — LACTATED RINGERS IV BOLUS
1000.0000 mL | Freq: Once | INTRAVENOUS | Status: AC
  Administered 2022-02-19: 1000 mL via INTRAVENOUS

## 2022-02-19 MED ORDER — CHLORDIAZEPOXIDE HCL 25 MG PO CAPS: ORAL_CAPSULE | ORAL | 0 refills | Status: AC

## 2022-02-19 MED ORDER — THIAMINE HCL 100 MG PO TABS: 100.0000 mg | ORAL_TABLET | Freq: Every day | ORAL | 0 refills | Status: AC

## 2022-02-19 NOTE — Discharge Instructions (Addendum)
If you develop new or worsening symptoms such as tremors, severe headache, vision problems, dizziness or inability to walk, weakness or numbness, or any other new/concerning symptoms then return to the ER for evaluation. ? ?It is important to find place to detox to help with your alcohol abuse.  You should not try and do this at home by yourself. You are being given resources for this. ? ?It is also important to find a primary care physician for follow-up and primary care. ?

## 2022-02-19 NOTE — ED Notes (Signed)
Remined pt multiple time about the need of the  urine sample . Pt reports unable to void at this time  ?

## 2022-02-19 NOTE — ED Notes (Signed)
Patient unable to urinate at this time. Will obtain urine at later time.  ?

## 2022-02-19 NOTE — ED Provider Notes (Addendum)
[facility] EMERGENCY DEPARTMENT Provider Note   CSN: [id number] Arrival date & time: 02/19/22  [id number]     History  Chief Complaint  Patient presents with   Alcohol Problem    Karen Marshall is a 46 y.o. female.  HPI 46 year old female presents with concern for alcohol withdrawal.  She works night shift and states that she last drank yesterday morning around this time.  Drank 4 beers.  She used to drink 1/5 of vodka but now she has been drinking beer for the past month or so.  Usually drinks about 4 or 5 beers.  However she has been feeling ill with a cough and chest tightness for at least a week.  [facility]  She was concerned alcohol might be playing a role so she has wanted to stop it because she felt bad last night she decided to have no more beer.  This morning she had some dry heaving, headache, spots in her vision and it felt like the floor/bedsheets were moving.  The visual complaints and floor/bed sheets moving has resolved and lasted less than an hour.  The headache she thinks is from coughing so much and is something she has had before and rates as a moderate headache.  She does feel lightheaded when she gets up to walk but has been able to walk.  She is still nauseated.  The chest tightness has been there for at least a week and is constant.  She has been short of breath on exertion for several weeks, mostly noticing this at work.  She states she has gone through alcohol withdrawal before and had diarrhea (which she is having now as well) and the visual complaints.  She does not remember the moving sensation though she was at that time seeing hallucinations.  Home Medications Prior to Admission medications   Medication Sig Start Date End Date Taking? Authorizing Provider  chlordiazePOXIDE (LIBRIUM) 25 MG capsule 50mg  PO TID x 1D, then 25-50mg  PO BID X 1D, then 25-50mg  PO QD X 1D 02/19/22  Yes Pricilla LovelessGoldston, Colon Rueth, MD  Magnesium Oxide (MAG-OXIDE) 200 MG  TABS Take 1 tablet (200 mg total) by mouth in the morning and at bedtime for 5 days. 02/19/22 02/24/22 Yes Pricilla LovelessGoldston, Keyshawna Prouse, MD  thiamine 100 MG tablet Take 1 tablet (100 mg total) by mouth daily. 02/19/22  Yes Pricilla LovelessGoldston, Kailand Seda, MD  acetaminophen (TYLENOL) 500 MG tablet Take 1,000 mg by mouth every 6 (six) hours as needed for mild pain.    [provider]  albuterol (VENTOLIN HFA) 108 (90 Base) MCG/ACT inhaler Inhale 2 puffs into the lungs every 6 (six) hours as needed for wheezing or shortness of breath. 10/10/21   [provider]  chlorpheniramine-HYDROcodone (TUSSIONEX PENNKINETIC ER) 10-8 MG/5ML Take 5 mLs by mouth every 12 (twelve) hours as needed for cough. 01/29/22   Jacalyn LefevreHaviland, Julie, MD  ferrous gluconate (FERGON) 324 MG tablet Take 324 mg by mouth daily. 01/11/21   [provider]  levothyroxine (SYNTHROID) 75 MCG tablet Take 75 mcg by mouth daily.    [provider]  LORazepam (ATIVAN) 1 MG tablet Take 1 tablet (1 mg total) by mouth every 8 (eight) hours as needed for anxiety. 01/29/22   Jacalyn LefevreHaviland, Julie, MD  Multiple Vitamin [facility] ESSENTIAL ONE DAILY) TABS Take 1 tablet by mouth daily.    [provider]  ondansetron (ZOFRAN ODT) 4 MG disintegrating tablet Take 1 tablet (4 mg total) by mouth every 8 (eight)  hours as needed for nausea or vomiting. 10/15/21   Sponseller, Lupe Carney R, PA-C  sucralfate (CARAFATE) 1 g tablet Take 1 tablet (1 g total) by mouth 4 (four) times daily as needed. Patient taking differently: Take 1 g by mouth 4 (four) times daily as needed (abdominal pain). 08/22/21   Sabas Sous, MD  vitamin C (ASCORBIC ACID) 500 MG tablet Take 500 mg by mouth daily.    [provider]      Allergies    Kiwi extract, Prednisone, Other, Aspirin, and Eletriptan    Review of Systems   Review of Systems  Constitutional:  Negative for fever.  Eyes:  Positive for visual disturbance.  Respiratory:  Positive for cough and shortness of breath.    Cardiovascular:  Positive for chest pain.  Gastrointestinal:  Positive for diarrhea, nausea and vomiting. Negative for abdominal pain.  Musculoskeletal:  Negative for neck pain.  Neurological:  Positive for tremors, light-headedness, numbness (bilateral hands) and headaches. Negative for dizziness and weakness.   Physical Exam Updated Vital Signs BP 133/62    Pulse 78    Temp 98.2 F (36.8 C) (Oral)    Resp (!) 26    Ht 5\' 5"  (1.651 m)    Wt 102.1 kg    LMP 01/29/2022    SpO2 94%    BMI 37.44 kg/m  Physical Exam Vitals and nursing note reviewed.  Constitutional:      Appearance: She is well-developed. She is not diaphoretic.  HENT:     Head: Normocephalic and atraumatic.  Eyes:     Extraocular Movements: Extraocular movements intact.     Pupils: Pupils are equal, round, and reactive to light.  Cardiovascular:     Rate and Rhythm: Normal rate and regular rhythm.     Heart sounds: Normal heart sounds.  Pulmonary:     Effort: Pulmonary effort is normal.     Breath sounds: Normal breath sounds.  Abdominal:     Palpations: Abdomen is soft.     Tenderness: There is no abdominal tenderness.  Musculoskeletal:     Cervical back: Neck supple.  Skin:    General: Skin is warm and dry.  Neurological:     Mental Status: She is alert and oriented to person, place, and time.     Comments: Patient knows she is in the hospital but at first states the wrong hospital name and then is able to correct.  Similar with the month and year, she states the wrong month and year but then quickly corrects herself.  CN 3-12 grossly intact. 5/5 strength in all 4 extremities. Grossly normal sensation. Normal finger to nose. Is tremulous on activity in upper arms/hands. She is able to ambulate slowly without ataxia. Feels lightheaded while doing this    ED Results / Procedures / Treatments   Labs (all labs ordered are listed, but only abnormal results are displayed) Labs Reviewed  COMPREHENSIVE METABOLIC  PANEL - Abnormal; Notable for the following components:      Result Value   Chloride 96 (*)    AST 93 (*)    Total Bilirubin 1.3 (*)    All other components within normal limits  CBC WITH DIFFERENTIAL/PLATELET - Abnormal; Notable for the following components:   WBC 3.9 (*)    RBC 3.79 (*)    Hemoglobin 9.1 (*)    HCT 30.5 (*)    MCH 24.0 (*)    MCHC 29.8 (*)    RDW 24.9 (*)  All other components within normal limits  MAGNESIUM - Abnormal; Notable for the following components:   Magnesium 1.1 (*)    All other components within normal limits  RESP PANEL BY RT-PCR (FLU A&B, COVID) ARPGX2  ETHANOL  CBG MONITORING, ED  TROPONIN I (HIGH SENSITIVITY)    EKG EKG Interpretation  Date/Time:  Sunday February 19 2022 08:49:20 EST Ventricular Rate:  71 PR Interval:  135 QRS Duration: 100 QT Interval:  422 QTC Calculation: 459 R Axis:   58 Text Interpretation: Sinus rhythm no acute ST/T changes Confirmed by Pricilla Loveless [phone]) on 02/19/2022 10:18:26 AM  Radiology DG Chest 2 View  Result Date: 02/19/2022 CLINICAL DATA:  Cough, chest tightness EXAM: CHEST - 2 VIEW COMPARISON:  01/29/2022 FINDINGS: No focal consolidation. No pleural effusion or pneumothorax. Heart and mediastinal contours are unremarkable. No acute osseous abnormality. IMPRESSION: No active cardiopulmonary disease. Electronically Signed   By: Elige Ko M.D.   On: 02/19/2022 09:33   CT Head Wo Contrast  Result Date: 02/19/2022 CLINICAL DATA:  46 year old female with dizziness, weakness, tremors, diaphoresis. Possible alcohol withdrawal. EXAM: CT HEAD WITHOUT CONTRAST TECHNIQUE: Contiguous axial images were obtained from the base of the skull through the vertex without intravenous contrast. RADIATION DOSE REDUCTION: This exam was performed according to the departmental dose-optimization program which includes automated exposure control, adjustment of the mA and/or kV according to patient size and/or use of iterative  reconstruction technique. COMPARISON:  None. FINDINGS: Brain: Age advanced cerebral volume loss with disproportionate cerebellar atrophy (series 2, image 15 and series 5, image 29). No midline shift, ventriculomegaly, mass effect, evidence of mass lesion, intracranial hemorrhage or evidence of cortically based acute infarction. Gray-white matter differentiation is within normal limits throughout the brain. No cortical encephalomalacia identified. Vascular: No suspicious intracranial vascular hyperdensity. Skull: Negative. Sinuses/Orbits: Visualized paranasal sinuses and mastoids are clear. Tympanic cavities are clear. Other: Visualized orbits and scalp soft tissues are within normal limits. IMPRESSION: 1. No acute intracranial abnormality. 2. Cerebral Atrophy (ICD10-G31.9), with disproportionate atrophy of the cerebellum. Electronically Signed   By: Odessa Fleming M.D.   On: 02/19/2022 09:57    Procedures Procedures    Medications Ordered in ED Medications  lactated ringers bolus 1,000 mL (0 mLs Intravenous Stopped 02/19/22 1101)  LORazepam (ATIVAN) injection 1 mg (1 mg Intravenous Given 02/19/22 0927)  metoCLOPramide (REGLAN) injection 10 mg (10 mg Intravenous Given 02/19/22 0926)  magnesium sulfate IVPB 2 g 50 mL (0 g Intravenous Stopped 02/19/22 1101)  chlordiazePOXIDE (LIBRIUM) capsule 50 mg (50 mg Oral Given 02/19/22 1204)    ED Course/ Medical Decision Making/ A&P                           Medical Decision Making Amount and/or Complexity of Data Reviewed Labs: ordered. Radiology: ordered.  Risk OTC drugs. Prescription drug management.   Patient is feeling better after fluids, reglan and ativan. She has CIWA of 10, supporting her concern for ETOH withdrawal. Given multiple complaints including her vision complaints and headache, CT was obtained. I have personally viewed images and see no edema, infarct or bleed. Labs are most significant for normal ETOH level and significant hypomagnesemia. However,  her ECG was reviewed and there is not QTC prolongation. She was given IV mag. No urinary symptoms. She is ambulating normally. Given improvement in symptoms, she is ok with going home. Will give librium Rx to help prevent withdrawal. Will also Rx mag-ox and thiamine. At  this point, I think she is stable for d/c but recommended she get formal detox.         Final Clinical Impression(s) / ED Diagnoses Final diagnoses:  Alcohol withdrawal syndrome without complication (HCC)  Hypomagnesemia    Rx / DC Orders ED Discharge Orders          Ordered    chlordiazePOXIDE (LIBRIUM) 25 MG capsule        02/19/22 1227    Magnesium Oxide (MAG-OXIDE) 200 MG TABS  2 times daily        02/19/22 1227    thiamine 100 MG tablet  Daily        02/19/22 1227              Pricilla Loveless, MD 02/19/22 1649    Pricilla Loveless, MD 02/19/22 [phone]

## 2022-02-19 NOTE — ED Triage Notes (Signed)
Pt arrives pov, to room in wheelchair, report alcohol withdrawal, last drink 24 hrs pta. Pt reports n/v, dizziness and weakness. Endorses tremors, pt is diaphoretic. Drank 3 beers yesterday ?

## 2022-02-19 NOTE — ED Notes (Signed)
ED Provider at bedside. 

## 2022-02-26 ENCOUNTER — Emergency Department (HOSPITAL_COMMUNITY)
Admission: EM | Admit: 2022-02-26 | Discharge: 2022-02-26 | Disposition: A | Discharge status: Home / Self Care | Payer: BC Managed Care – PPO | Attending: Emergency Medicine | Admitting: Emergency Medicine

## 2022-02-26 ENCOUNTER — Encounter (HOSPITAL_COMMUNITY): Payer: Self-pay | Admitting: *Deleted

## 2022-02-26 ENCOUNTER — Emergency Department (HOSPITAL_COMMUNITY): Payer: BC Managed Care – PPO

## 2022-02-26 ENCOUNTER — Other Ambulatory Visit: Payer: Self-pay

## 2022-02-26 DIAGNOSIS — J45909 Unspecified asthma, uncomplicated: Secondary | ICD-10-CM | POA: Insufficient documentation

## 2022-02-26 DIAGNOSIS — M25552 Pain in left hip: Secondary | ICD-10-CM | POA: Diagnosis not present

## 2022-02-26 MED ORDER — NAPROXEN 500 MG PO TABS: 500.0000 mg | ORAL_TABLET | Freq: Two times a day (BID) | ORAL | 0 refills | Status: AC

## 2022-02-26 MED ORDER — METHOCARBAMOL 500 MG PO TABS: 500.0000 mg | ORAL_TABLET | Freq: Every evening | ORAL | 0 refills | Status: AC

## 2022-02-26 MED ORDER — DICLOFENAC SODIUM 1 % EX GEL: 2.0000 g | Freq: Four times a day (QID) | CUTANEOUS | 0 refills | Status: AC

## 2022-02-26 NOTE — ED Notes (Signed)
Pt reports pain is intermittent and increases w/ lifting and movement.  ?

## 2022-02-26 NOTE — ED Provider Notes (Signed)
[facility] DEPT Provider Note   CSN: [id number] Arrival date & time: 02/26/22  [id number]     History  Chief Complaint  Patient presents with   Hip Pain    Karen Marshall is a 46 y.o. female with a history of asthma, anemia, and thyroid disease presenting today with 1 week of left worsening hip pain.  Denies recent trauma or fall.  Has been overusing her leg/hip helping her mother around the house.  Has had hip pain before but not this severe.  Pain radiates from left low back, into the left hip, and ends around the mid-thigh.  States her leg feels stiff from overuse.  Denies saddle anesthesia or urinary/bowel incontinence.  Denies leg pain or other symptoms.  Has been sober for 10 days and feels "off balance" when walking on occasion, usually happening after overuse/exertion.  Denies dizziness, chest pain, palpitations, vision changes, or shortness of breath.  The history is provided by the patient and medical records.  Hip Pain      Home Medications Prior to Admission medications   Medication Sig Start Date End Date Taking? Authorizing Provider  diclofenac Sodium (VOLTAREN) 1 % GEL Apply 2 g topically 4 (four) times daily. 02/26/22  Yes Prince Rome, PA-C  methocarbamol (ROBAXIN) 500 MG tablet Take 1 tablet (500 mg total) by mouth at bedtime for 7 days. 02/26/22 03/05/22 Yes Prince Rome, PA-C  naproxen (NAPROSYN) 500 MG tablet Take 1 tablet (500 mg total) by mouth 2 (two) times daily. 02/26/22  Yes Prince Rome, PA-C  acetaminophen (TYLENOL) 500 MG tablet Take 1,000 mg by mouth every 6 (six) hours as needed for mild pain.    [provider]  albuterol (VENTOLIN HFA) 108 (90 Base) MCG/ACT inhaler Inhale 2 puffs into the lungs every 6 (six) hours as needed for wheezing or shortness of breath. 10/10/21   [provider]  chlordiazePOXIDE (LIBRIUM) 25 MG capsule 50mg  PO TID x 1D, then 25-50mg  PO BID X 1D, then 25-50mg  PO QD X  1D 02/19/22   Sherwood Gambler, MD  chlorpheniramine-HYDROcodone [facility] PENNKINETIC ER) 10-8 MG/5ML Take 5 mLs by mouth every 12 (twelve) hours as needed for cough. 01/29/22   Isla Pence, MD  ferrous gluconate (FERGON) 324 MG tablet Take 324 mg by mouth daily. 01/11/21   [provider]  levothyroxine (SYNTHROID) 75 MCG tablet Take 75 mcg by mouth daily.    [provider]  LORazepam (ATIVAN) 1 MG tablet Take 1 tablet (1 mg total) by mouth every 8 (eight) hours as needed for anxiety. 01/29/22   Isla Pence, MD  Multiple Vitamin [facility] ESSENTIAL ONE DAILY) TABS Take 1 tablet by mouth daily.    [provider]  ondansetron (ZOFRAN ODT) 4 MG disintegrating tablet Take 1 tablet (4 mg total) by mouth every 8 (eight) hours as needed for nausea or vomiting. 10/15/21   Sponseller, Eugene Garnet R, PA-C  sucralfate (CARAFATE) 1 g tablet Take 1 tablet (1 g total) by mouth 4 (four) times daily as needed. Patient taking differently: Take 1 g by mouth 4 (four) times daily as needed (abdominal pain). 08/22/21   Maudie Flakes, MD  thiamine 100 MG tablet Take 1 tablet (100 mg total) by mouth daily. 02/19/22   Sherwood Gambler, MD  vitamin C (ASCORBIC ACID) 500 MG tablet Take 500 mg by mouth daily.    [provider]      Allergies    Kiwi extract, Prednisone, Other, Aspirin,  and Eletriptan    Review of Systems   Review of Systems  Musculoskeletal:  Positive for back pain.       Leg pain   Physical Exam Updated Vital Signs BP (!) 159/89    Pulse 67    Temp 97.9 F (36.6 C)    Resp 16    LMP 01/29/2022    SpO2 100%  Physical Exam Vitals and nursing note reviewed.  Constitutional:      General: She is not in acute distress.    Appearance: She is well-developed. She is not ill-appearing or diaphoretic.  HENT:     Head: Normocephalic and atraumatic.  Eyes:     Conjunctiva/sclera: Conjunctivae normal.  Cardiovascular:     Rate and Rhythm: Normal rate and regular rhythm.      Heart sounds: Normal heart sounds. No murmur heard. Pulmonary:     Effort: Pulmonary effort is normal. No respiratory distress.     Breath sounds: Normal breath sounds.  Abdominal:     General: Bowel sounds are normal.     Palpations: Abdomen is soft.     Tenderness: There is no abdominal tenderness.  Musculoskeletal:        General: No swelling.     Cervical back: Neck supple.       Back:       Legs:     Comments: Area over left SI joint more TTP than right as indicated above Area of left trochanteric bursa TTP Subjective minimally decreased sensation of left lateral proximal thigh Mildly reduced AROM of left hip, full PROM of both LEs  Skin:    General: Skin is warm and dry.     Capillary Refill: Capillary refill takes less than 2 seconds.  Neurological:     Mental Status: She is alert and oriented to person, place, and time.  Psychiatric:        Mood and Affect: Mood normal.    ED Results / Procedures / Treatments   Labs (all labs ordered are listed, but only abnormal results are displayed) Labs Reviewed - No data to display  EKG None  Radiology DG Lumbar Spine Complete  Result Date: 02/26/2022 CLINICAL DATA:  Chronic lower back pain EXAM: LUMBAR SPINE - COMPLETE 4+ VIEW COMPARISON:  Abdominal CT 08/22/2021 FINDINGS: No acute fracture, subluxation, or evidence of bone lesion. Degenerative facet spurring especially at L3-4 and below. Generalized mild disc space narrowing and endplate spurring. IMPRESSION: No acute or focal finding. Generalized degenerative endplate and facet spurring Electronically Signed   By: Tiburcio Pea M.D.   On: 02/26/2022 08:26   DG Hip Unilat With Pelvis 2-3 Views Left  Result Date: 02/26/2022 CLINICAL DATA:  Low back pain EXAM: DG HIP (WITH OR WITHOUT PELVIS) 2-3V LEFT COMPARISON:  None. FINDINGS: There is no evidence of hip fracture or dislocation. There is no evidence of arthropathy or other focal bone abnormality. IMPRESSION: Negative.  Electronically Signed   By: Tiburcio Pea M.D.   On: 02/26/2022 08:27    Procedures Procedures    Medications Ordered in ED Medications - No data to display  ED Course/ Medical Decision Making/ A&P                           Medical Decision Making Amount and/or Complexity of Data Reviewed External Data Reviewed: notes. Radiology: ordered and independent interpretation performed. Decision-making details documented in ED Course.  Risk OTC drugs. Prescription drug management.  46 y.o. female presents to the ED for concern of Hip Pain.  This involves an extensive number of treatment options, and is a complaint that carries with it a high risk of complications and morbidity.  The differential diagnosis includes trochanteric bursitis, degenerative disc disease with or without radiculopathy, fracture, musculoskeletal strain  Comorbidities that complicate the patient evaluation include chronic alcohol use, thyroid disease, anemia, asthma  Additional history obtained from internal/external records available via epic  Interpretation: I ordered imaging studies including x-ray imaging of lumbar spine and left hip.  I independently visualized and interpreted imaging which showed no acute fracture or bony pathology.  I agree with the radiologist interpretation  ED Course: Patient with back pain.  Patient able to ambulate on her own normally.  Ambulated by herself and in the ED.  Well-appearing on physical exam.  Patient describing radiculopathy type symptoms on the left lower extremity.   No neurological deficits and normal neuro exam.  Patient history suggestive of overuse or trochanteric bursitis.  Not suspicious of cauda equina syndrome or acute fracture due to lack of recent trauma and lack of red flag symptoms, patient is low risk.  Denies saddle anesthesia or urinary/bowel incontinence and no saddle anesthesia observed on physical exam.  Imaging negative for fracture and physical exam  without bony tenderness.  Patient can walk but states is painful.  No fever, night sweats, weight loss, h/o cancer, IVDU.  Heat therapy, anti-inflammatory and muscle relaxants indicated and discussed with patient.  Follow up outpatient with orthopedics for further evaluation.  Disposition: I discussed the patient and their case with my attending, Dr. Kathrynn Humble, who agreed with the proposed treatment course.  After consideration of the diagnostic results and the patient's response to treatment, I feel that the patent would benefit from outpatient follow-up with orthopedics and a short course of steroids and muscle relaxants.  Discussed course of treatment thoroughly with the patient and she demonstrated understanding.  Patient in agreement and has no further questions.  This chart was dictated using voice recognition software.  Despite best efforts to proofread,  errors can occur which can change the documentation meaning.         Final Clinical Impression(s) / ED Diagnoses Final diagnoses:  Left hip pain    Rx / DC Orders ED Discharge Orders          Ordered    methocarbamol (ROBAXIN) 500 MG tablet  Nightly        02/26/22 0921    diclofenac Sodium (VOLTAREN) 1 % GEL  4 times daily        02/26/22 0921    naproxen (NAPROSYN) 500 MG tablet  2 times daily        02/26/22 [id number]              Prince Rome, PA-C [id number]    Varney Biles, MD 02/27/22 1044

## 2022-02-26 NOTE — ED Triage Notes (Signed)
Pt c/o on and off "stabbing" left hip pain for about a week. Says it feels like she is "walking on pebbles" ?

## 2022-02-26 NOTE — Discharge Instructions (Addendum)
You have been provided the contact information for local orthopedic office under the name of Dr. Linna Caprice.  Please call and schedule an appointment to establish care and for further evaluation. ? ?A prescription by the name of naproxen has been sent to your pharmacy.  You may take one tablet twice a day for anti-inflammatory relief.  If you develop nausea or vomiting please stop taking immediately.  ? ?Another prescription by the name of Voltaren topical gel has been sent to your pharmacy.  You may apply this to the area of concern 2 times per day.  ? ?Another prescription for a muscle relaxant by the name of Robaxin has been sent to your pharmacy.  Please take 1 pill before bedtime, this may have a side effect of drowsiness.  Do not operate machinery or drive when using this medication. ? ?Further information about these medications has been provided for you in your discharge paperwork.  In addition, back exercises have been provided for you to help rehabilitate your back. ? ?Return to the ED for new or worsening symptoms as discussed. ?

## 2022-02-27 ENCOUNTER — Other Ambulatory Visit: Payer: Self-pay | Admitting: Physician Assistant

## 2022-02-27 DIAGNOSIS — M7989 Other specified soft tissue disorders: Secondary | ICD-10-CM

## 2022-03-01 ENCOUNTER — Other Ambulatory Visit: Payer: Self-pay | Admitting: Physician Assistant

## 2022-03-01 DIAGNOSIS — R1900 Intra-abdominal and pelvic swelling, mass and lump, unspecified site: Secondary | ICD-10-CM

## 2022-03-22 ENCOUNTER — Ambulatory Visit
Admission: RE | Admit: 2022-03-22 | Discharge: 2022-03-22 | Disposition: A | Discharge status: Home / Self Care | Payer: BC Managed Care – PPO | Source: Ambulatory Visit | Attending: Physician Assistant | Admitting: Physician Assistant

## 2022-03-22 DIAGNOSIS — R1900 Intra-abdominal and pelvic swelling, mass and lump, unspecified site: Secondary | ICD-10-CM

## 2022-03-22 MED ORDER — IOPAMIDOL (ISOVUE-300) INJECTION 61%
100.0000 mL | Freq: Once | INTRAVENOUS | Status: AC | PRN
  Administered 2022-03-22: 100 mL via INTRAVENOUS

## 2022-04-13 ENCOUNTER — Encounter (HOSPITAL_COMMUNITY): Payer: Self-pay

## 2022-04-13 ENCOUNTER — Other Ambulatory Visit: Payer: Self-pay

## 2022-04-13 ENCOUNTER — Ambulatory Visit (HOSPITAL_COMMUNITY): Admission: RE | Admit: 2022-04-13 | Payer: BC Managed Care – PPO | Source: Ambulatory Visit

## 2022-04-13 ENCOUNTER — Emergency Department (HOSPITAL_COMMUNITY)
Admission: EM | Admit: 2022-04-13 | Discharge: 2022-04-13 | Disposition: A | Discharge status: Home / Self Care | Payer: BC Managed Care – PPO | Attending: Emergency Medicine | Admitting: Emergency Medicine

## 2022-04-13 DIAGNOSIS — M79604 Pain in right leg: Secondary | ICD-10-CM | POA: Diagnosis present

## 2022-04-13 DIAGNOSIS — F4389 Other reactions to severe stress: Secondary | ICD-10-CM | POA: Insufficient documentation

## 2022-04-13 DIAGNOSIS — M79651 Pain in right thigh: Secondary | ICD-10-CM | POA: Insufficient documentation

## 2022-04-13 DIAGNOSIS — F410 Panic disorder [episodic paroxysmal anxiety] without agoraphobia: Secondary | ICD-10-CM

## 2022-04-13 DIAGNOSIS — G8929 Other chronic pain: Secondary | ICD-10-CM | POA: Insufficient documentation

## 2022-04-13 DIAGNOSIS — F419 Anxiety disorder, unspecified: Secondary | ICD-10-CM | POA: Insufficient documentation

## 2022-04-13 DIAGNOSIS — R202 Paresthesia of skin: Secondary | ICD-10-CM | POA: Diagnosis not present

## 2022-04-13 DIAGNOSIS — Z659 Problem related to unspecified psychosocial circumstances: Secondary | ICD-10-CM

## 2022-04-13 NOTE — ED Provider Notes (Signed)
?[facility]-EMERGENCY DEPT ?Provider Note ? ? ?CSN: [id number] ?Arrival date & time: 04/13/22  0051 ? ?  ? ?History ? ?Chief Complaint  ?Patient presents with  ? Leg Pain  ? Anxiety  ? ? ?Karen Marshall is a 46 y.o. female with past medical history of alcohol abuse, anxiety and depression, migraines, elevated BMI, thyroid disease, status post gastric bypass, who presents today for evaluation of chronic pain in her right leg.  She states that she has recently been seeing a orthopedist for her chronic pain in her right thigh.  She notes that she lost her insurance within the past week as she had to go on disability due to her pain in her legs.  She states that she had an MRI done with her orthopedist last week however has been unable to follow-up with them on these results. ? ?She reports that not having insurance is causing her to have a anxiety attack. ?He has a history of anxiety and states that this is not abnormal for her. ? ?She states that her pain in her right thigh is slightly worse today, however is otherwise her baseline and unchanged. ? ? ?HPI ? ?  ? ?Home Medications ?Prior to Admission medications   ?Medication Sig Start Date End Date Taking? Authorizing Provider  ?acetaminophen (TYLENOL) 500 MG tablet Take 1,000 mg by mouth every 6 (six) hours as needed for mild pain.    [provider]  ?albuterol (VENTOLIN HFA) 108 (90 Base) MCG/ACT inhaler Inhale 2 puffs into the lungs every 6 (six) hours as needed for wheezing or shortness of breath. 10/10/21   [provider]  ?chlordiazePOXIDE (LIBRIUM) 25 MG capsule 50mg  PO TID x 1D, then 25-50mg  PO BID X 1D, then 25-50mg  PO QD X 1D 02/19/22   Pricilla LovelessGoldston, Scott, MD  ?chlorpheniramine-HYDROcodone [facility](TUSSIONEX PENNKINETIC ER) 10-8 MG/5ML Take 5 mLs by mouth every 12 (twelve) hours as needed for cough. 01/29/22   Jacalyn LefevreHaviland, Julie, MD  ?diclofenac Sodium (VOLTAREN) 1 % GEL Apply 2 g topically 4 (four) times daily. 02/26/22   Cecil Cobbsockerham,  Alicia M, PA-C  ?ferrous gluconate (FERGON) 324 MG tablet Take 324 mg by mouth daily. 01/11/21   [provider]  ?levothyroxine (SYNTHROID) 75 MCG tablet Take 75 mcg by mouth daily.    [provider]  ?LORazepam (ATIVAN) 1 MG tablet Take 1 tablet (1 mg total) by mouth every 8 (eight) hours as needed for anxiety. 01/29/22   Jacalyn LefevreHaviland, Julie, MD  ?Multiple Vitamin [facility] ESSENTIAL ONE DAILY) TABS Take 1 tablet by mouth daily.    [provider]  ?naproxen (NAPROSYN) 500 MG tablet Take 1 tablet (500 mg total) by mouth 2 (two) times daily. 02/26/22   Cecil Cobbsockerham, Alicia M, PA-C  ?ondansetron (ZOFRAN ODT) 4 MG disintegrating tablet Take 1 tablet (4 mg total) by mouth every 8 (eight) hours as needed for nausea or vomiting. 10/15/21   Sponseller, Eugene Gaviaebekah R, PA-C  ?sucralfate (CARAFATE) 1 g tablet Take 1 tablet (1 g total) by mouth 4 (four) times daily as needed. ?Patient taking differently: Take 1 g by mouth 4 (four) times daily as needed (abdominal pain). 08/22/21   Sabas SousBero, Michael M, MD  ?thiamine 100 MG tablet Take 1 tablet (100 mg total) by mouth daily. 02/19/22   Pricilla LovelessGoldston, Scott, MD  ?vitamin C (ASCORBIC ACID) 500 MG tablet Take 500 mg by mouth daily.    [provider]  ?   ? ?Allergies    ?Kiwi extract, Prednisone, Other,  Aspirin, and Eletriptan   ? ?Review of Systems   ?Review of Systems ? ?Physical Exam ?Updated Vital Signs ?BP (!) 161/103   Pulse 66   Temp 98.2 ?F (36.8 ?C) (Oral)   Resp 19   SpO2 97%  ?Physical Exam ?Vitals and nursing note reviewed.  ?Constitutional:   ?   General: She is not in acute distress. ?   Appearance: She is not ill-appearing.  ?HENT:  ?   Head: Normocephalic.  ?Cardiovascular:  ?   Rate and Rhythm: Normal rate.  ?   Comments: 2+ DP/PT pulses bilaterally.  Bilateral feet are warm and well perfused. ?Pulmonary:  ?   Effort: Pulmonary effort is normal. No respiratory distress.  ?Musculoskeletal:  ?   Comments: Right leg with diffuse tenderness over the calf.   Otherwise through entire bilateral legs compartments are soft and compressible.  There is generalized tenderness through right thigh.  ?Skin: ?   General: Skin is warm and dry.  ?Neurological:  ?   Mental Status: She is alert.  ?   Sensory: No sensory deficit (Sensation intact to light touch to bilateral lower extremities.).  ?Psychiatric:  ?   Comments: Tearful, anxious  ? ? ?ED Results / Procedures / Treatments   ?Labs ?(all labs ordered are listed, but only abnormal results are displayed) ?Labs Reviewed - No data to display ? ?EKG ?None ? ?Radiology ?No results found. ? ?Procedures ?Procedures  ? ? ?Medications Ordered in ED ?Medications - No data to display ? ?ED Course/ Medical Decision Making/ A&P ?  ?                        ?Medical Decision Making ?Patient is a 46 year old woman who presents today for evaluation of chronic pain in her right leg.  She has recently seen orthopedics for this but has been unable to follow-up with them on the results from her MRI from last week due to not having insurance anymore. ?I am unable to view her MRI results.  She denies any changes to bowel or bladder function, no saddle anesthesia or paresthesia no red flag signs. ? ?She has not had any new trauma since her MRI, and she has had pains in this leg in a of similar nature for over 4 years. ?She states that the not having insurance is causing her to have anxiety. ?She is given resources. ?Given that her pain is chronic I do not think that initiation of narcotics from the emergency room is appropriate. ?With her having pain in that calf will order a DVT study. ?Due to overall low suspicion for a DVT through shared decision making will hold on any anticoagulants. ? ?She is given information on safe and appropriate dosing of OTC medications. ? ?Given her pain is better chronic and ongoing for 4 years I doubt dissection as a cause of her pain. ? ?She does have paresthesias per her report in her bilateral lower extremitie but is  otherwise neuro intact.   Recommended primary care follow-up. ? ?Return precautions were discussed with patient who states their understanding.  At the time of discharge patient denied any unaddressed complaints or concerns.  Patient is agreeable for discharge home. ? ?Note: Portions of this report may have been transcribed using voice recognition software. Every effort was made to ensure accuracy; however, inadvertent computerized transcription errors may be present ? ? ? ? ? ?Amount and/or Complexity of Data Reviewed ?Radiology:  ?   Details:  I attempted to review external MRI, not able to see ? ?Risk ?OTC drugs. ?Decision regarding hospitalization. ? ? ?Return precautions were discussed with patient who states their understanding.  At the time of discharge patient denied any unaddressed complaints or concerns.  Patient is agreeable for discharge home. ? ?Note: Portions of this report may have been transcribed using voice recognition software. Every effort was made to ensure accuracy; however, inadvertent computerized transcription errors may be present ? ? ? ? ? ? ? ? ?Final Clinical Impression(s) / ED Diagnoses ?Final diagnoses:  ?Anxiety attack  ?Right leg pain  ?Other social stressor  ? ? ?Rx / DC Orders ?ED Discharge Orders   ? ?      Ordered  ?  LE VENOUS       ? 04/13/22 0431  ? ?  ?  ? ?  ? ? ?  ?Cristina Gong, [place] ?04/13/22 4709 ? ?  ?Cathren Laine, MD ?04/13/22 2331 ? ?

## 2022-04-13 NOTE — Discharge Instructions (Addendum)
Please schedule a follow-up appointment with your orthopedist. ? ?I would also recommend that you follow-up with your primary care doctor. ?If you are unable to see them I have given you the information for the wellness clinic. ? ?Please take Tylenol (acetaminophen) to relieve your pain.  You may take tylenol, up to 1,000 mg (two extra strength pills).  Do not take more than 3,000 mg tylenol in a 24 hour period.  Please check all medication labels as many medications such as pain and cold medications may contain tylenol. Please do not drink alcohol while taking this medication.  ? ? ?I have given you information on the maximum safe dosing of Tylenol.  Please do not take more than this. ?

## 2022-04-13 NOTE — ED Triage Notes (Signed)
Pt reports with leg pain x 3 days and anxiety due to her leg pain. Pt was following ortho but lost her insurance.  ?

## 2022-05-26 ENCOUNTER — Encounter (HOSPITAL_COMMUNITY): Payer: Self-pay

## 2022-05-26 ENCOUNTER — Ambulatory Visit: Payer: Self-pay

## 2022-05-26 ENCOUNTER — Emergency Department (HOSPITAL_COMMUNITY)
Admission: EM | Admit: 2022-05-26 | Discharge: 2022-05-26 | Disposition: A | Discharge status: Home / Self Care | Payer: BC Managed Care – PPO | Attending: Emergency Medicine | Admitting: Emergency Medicine

## 2022-05-26 DIAGNOSIS — D649 Anemia, unspecified: Secondary | ICD-10-CM | POA: Insufficient documentation

## 2022-05-26 DIAGNOSIS — J45909 Unspecified asthma, uncomplicated: Secondary | ICD-10-CM | POA: Diagnosis not present

## 2022-05-26 DIAGNOSIS — R42 Dizziness and giddiness: Secondary | ICD-10-CM | POA: Diagnosis present

## 2022-05-26 LAB — CBC WITH DIFFERENTIAL/PLATELET
Abs Immature Granulocytes: 0.03 10*3/uL (ref 0.00–0.07)
Basophils Absolute: 0 10*3/uL (ref 0.0–0.1)
Basophils Relative: 1 %
Eosinophils Absolute: 0 10*3/uL (ref 0.0–0.5)
Eosinophils Relative: 1 %
HCT: 32.1 % — ABNORMAL LOW (ref 36.0–46.0)
Hemoglobin: 9.6 g/dL — ABNORMAL LOW (ref 12.0–15.0)
Immature Granulocytes: 1 %
Lymphocytes Relative: 32 %
Lymphs Abs: 1.7 10*3/uL (ref 0.7–4.0)
MCH: 23.8 pg — ABNORMAL LOW (ref 26.0–34.0)
MCHC: 29.9 g/dL — ABNORMAL LOW (ref 30.0–36.0)
MCV: 79.7 fL — ABNORMAL LOW (ref 80.0–100.0)
Monocytes Absolute: 0.5 10*3/uL (ref 0.1–1.0)
Monocytes Relative: 10 %
Neutro Abs: 2.9 10*3/uL (ref 1.7–7.7)
Neutrophils Relative %: 55 %
Platelets: 405 10*3/uL — ABNORMAL HIGH (ref 150–400)
RBC: 4.03 MIL/uL (ref 3.87–5.11)
RDW: 22 % — ABNORMAL HIGH (ref 11.5–15.5)
WBC: 5.2 10*3/uL (ref 4.0–10.5)
nRBC: 0 % (ref 0.0–0.2)

## 2022-05-26 LAB — COMPREHENSIVE METABOLIC PANEL
ALT: 15 U/L (ref 0–44)
AST: 23 U/L (ref 15–41)
Albumin: 4.3 g/dL (ref 3.5–5.0)
Alkaline Phosphatase: 64 U/L (ref 38–126)
Anion gap: 9 (ref 5–15)
BUN: 11 mg/dL (ref 6–20)
CO2: 28 mmol/L (ref 22–32)
Calcium: 9.2 mg/dL (ref 8.9–10.3)
Chloride: 103 mmol/L (ref 98–111)
Creatinine, Ser: 0.62 mg/dL (ref 0.44–1.00)
GFR, Estimated: >60 mL/min (ref >60)
Glucose, Bld: 102 mg/dL — ABNORMAL HIGH (ref 70–99)
Potassium: 3.7 mmol/L (ref 3.5–5.1)
Sodium: 140 mmol/L (ref 135–145)
Total Bilirubin: 0.7 mg/dL (ref 0.3–1.2)
Total Protein: 7.9 g/dL (ref 6.5–8.1)

## 2022-05-26 MED ORDER — MECLIZINE HCL 25 MG PO TABS
25.0000 mg | ORAL_TABLET | Freq: Once | ORAL | Status: AC
  Administered 2022-05-26: 25 mg via ORAL
  Filled 2022-05-26: qty 1

## 2022-05-26 MED ORDER — MECLIZINE HCL 25 MG PO TABS: 25.0000 mg | ORAL_TABLET | Freq: Three times a day (TID) | ORAL | 0 refills | Status: AC | PRN

## 2022-05-26 NOTE — ED Triage Notes (Signed)
Pt c/o bilateral ear pain described as "fullness and hearing like popcorn popping". Pt states this has been ongoing for 3-4 days. Pt endorses intermittent dizziness and nausea.

## 2022-05-26 NOTE — Telephone Encounter (Signed)
  Chief Complaint: Ear problem, dizziness, nausea Symptoms: nausea, spinning feeling, ear drainage (right ear) Frequency: 2 weeks Pertinent Negatives: Patient denies fever Disposition: [x] ED /[] Urgent Care (no appt availability in office) / [] Appointment(In office/virtual)/ []  [place] [facility]/ [] Home Care/ [] Refused Recommended Disposition /[] [place] [place] [facility]/ []  Follow-up with PCP Additional Notes: Pt has these s/s for about 2 weeks. Pt has been able to eat a little bit I the past 24 hours. Pt has lost 10lbs over the last month.  PT has rt ear drainage into her throat. Summary: nausea, dizziness   Pt called in states cant eat because of nausea and has dizziness for 2 days and ears pop. Not a patient. She sates she doesn't have money to see Dr. Please call back for further assistance. PHARMACY - , - [address]  Phone: [phone]  Fax: [phone]      Reason for Disposition  SEVERE dizziness (vertigo) (e.g., unable to walk without assistance)  Answer Assessment - Initial Assessment Questions 1. NAUSEA SEVERITY: "How bad is the nausea?" (e.g., mild, moderate, severe; dehydration, weight loss)   - MILD: loss of appetite without change in eating habits   - MODERATE: decreased oral intake without significant weight loss, dehydration, or malnutrition   - SEVERE: inadequate caloric or fluid intake, significant weight loss, symptoms of dehydration     *No Answer* 2. ONSET: "When did the nausea begin?"     *No Answer* 3. VOMITING: "Any vomiting?" If Yes, ask: "How many times today?"     *No Answer* 4. RECURRENT SYMPTOM: "Have you had nausea before?" If Yes, ask: "When was the last time?" "What happened that time?"     *No Answer* 5. CAUSE: "What do you think is causing the nausea?"     Ear problems - drainage into throat. 6. PREGNANCY: "Is there any chance you are pregnant?" (e.g., unprotected intercourse, missed birth control  pill, broken condom)     no  Answer Assessment - Initial Assessment Questions 1. DESCRIPTION: "Describe your dizziness."     Feels like she is on a tilt-a-whirl 2. VERTIGO: "Do you feel like either you or the room is spinning or tilting?"      yes 3. LIGHTHEADED: "Do you feel lightheaded?" (e.g., somewhat faint, woozy, weak upon standing)     woozy 4. SEVERITY: "How bad is it?"  "Can you walk?"   - MILD: Feels slightly dizzy and unsteady, but is walking normally.   - MODERATE: Feels unsteady when walking, but not falling; interferes with normal activities (e.g., school, work).   - SEVERE: Unable to walk without falling, or requires assistance to walk without falling.     severe 5. ONSET:  "When did the dizziness begin?"     Nasal issues whole life - 3 days 6. AGGRAVATING FACTORS: "Does anything make it worse?" (e.g., standing, change in head position)     everything 7. CAUSE: "What do you think is causing the dizziness?"     Ear issues 8. RECURRENT SYMPTOM: "Have you had dizziness before?" If Yes, ask: "When was the last time?" "What happened that time?"     yes 9. OTHER SYMPTOMS: "Do you have any other symptoms?" (e.g., headache, weakness, numbness, vomiting, earache)     Ear drainage, nausea 10. PREGNANCY: "Is there any chance you are pregnant?" "When was your last menstrual period?"       na  Protocols used: Nausea-A-AH, Dizziness - Vertigo-A-AH

## 2022-05-26 NOTE — Discharge Instructions (Addendum)
I have prescribed the same medicine you took while in the emergency department.  It is called meclizine.  You are to take this 3 times daily as needed for dizziness.  If your symptoms persist, it may be worthwhile to get in with a ENT specialist.  I will provide a number to get in contact with them.  I do not suspect her symptoms are due to a more alarming cause such as stroke/tumor... given your description of symptoms.  I provided information packet on vertigo to read in your free time.   I do not think antibiotics are indicated at this time.  Consider over-the-counter decongestions such as Coricidin given that you have high blood pressure. Please do not hesitate to return to the emergency department for worrisome signs and symptoms we discussed become apparent.

## 2022-05-26 NOTE — ED Provider Notes (Signed)
[facility]-EMERGENCY DEPT Provider Note   CSN: [id number] Arrival date & time: 05/26/22  1059     History  Chief Complaint  Patient presents with   Otalgia   Nausea    Karen Marshall is a 46 y.o. female.   Otalgia Associated symptoms: no abdominal pain, no cough, no fever, no rash, no sore throat and no vomiting    46 year old female right-sided ear pain as well as nausea.  Patient states that symptoms began approximately 3 days ago insidiously when she was caring for her mother.  Describes the ear pain as pressure on the right side.  Associated nausea and "feeling like I am on a seesaw."  Denies fever, vomiting.  She noticed runny nose and congestion for 7 to 10 days prior to symptom onset.  Denies ear pain but notes right-sided "popcorn-like noise."  She visited emergency department for persistent nausea. Denies facial droop, dysarthria, increased difficulty walking, changes in vision, fever, chills, night sweats, chest pain, shortness of breath, abdominal pain, N/V/D, urinary/vaginal symptoms, change in bowel habits.   Past Medical History:  Diagnosis Date   Anemia    Asthma    Thyroid disease    Past Surgical History:  Procedure Laterality Date   gastic bypass     GASTRIC BYPASS       Home Medications Prior to Admission medications   Medication Sig Start Date End Date Taking? Authorizing Provider  meclizine (ANTIVERT) 25 MG tablet Take 1 tablet (25 mg total) by mouth 3 (three) times daily as needed for dizziness. 05/26/22  Yes Sherian Maroonobbins, Ankith Edmonston A, PA  acetaminophen (TYLENOL) 500 MG tablet Take 1,000 mg by mouth every 6 (six) hours as needed for mild pain.    [provider]  albuterol (VENTOLIN HFA) 108 (90 Base) MCG/ACT inhaler Inhale 2 puffs into the lungs every 6 (six) hours as needed for wheezing or shortness of breath. 10/10/21   [provider]  chlordiazePOXIDE (LIBRIUM) 25 MG capsule 50mg  PO TID x 1D, then 25-50mg  PO BID X  1D, then 25-50mg  PO QD X 1D 02/19/22   Pricilla LovelessGoldston, Scott, MD  chlorpheniramine-HYDROcodone [facility](TUSSIONEX PENNKINETIC ER) 10-8 MG/5ML Take 5 mLs by mouth every 12 (twelve) hours as needed for cough. 01/29/22   Jacalyn LefevreHaviland, Julie, MD  diclofenac Sodium (VOLTAREN) 1 % GEL Apply 2 g topically 4 (four) times daily. 02/26/22   Cecil Cobbsockerham, Alicia M, PA-C  ferrous gluconate (FERGON) 324 MG tablet Take 324 mg by mouth daily. 01/11/21   [provider]  levothyroxine (SYNTHROID) 75 MCG tablet Take 75 mcg by mouth daily.    [provider]  LORazepam (ATIVAN) 1 MG tablet Take 1 tablet (1 mg total) by mouth every 8 (eight) hours as needed for anxiety. 01/29/22   Jacalyn LefevreHaviland, Julie, MD  Multiple Vitamin [facility](GNP ESSENTIAL ONE DAILY) TABS Take 1 tablet by mouth daily.    [provider]  naproxen (NAPROSYN) 500 MG tablet Take 1 tablet (500 mg total) by mouth 2 (two) times daily. 02/26/22   Cecil Cobbsockerham, Alicia M, PA-C  ondansetron (ZOFRAN ODT) 4 MG disintegrating tablet Take 1 tablet (4 mg total) by mouth every 8 (eight) hours as needed for nausea or vomiting. 10/15/21   Sponseller, Lupe Carneyebekah R, PA-C  sucralfate (CARAFATE) 1 g tablet Take 1 tablet (1 g total) by mouth 4 (four) times daily as needed. Patient taking differently: Take 1 g by mouth 4 (four) times daily as needed (abdominal pain). 08/22/21   Sabas SousBero, Michael M, MD  thiamine 100 MG tablet Take 1 tablet (100 mg total) by mouth daily. 02/19/22   Pricilla Loveless, MD  vitamin C (ASCORBIC ACID) 500 MG tablet Take 500 mg by mouth daily.    [provider]      Allergies    Kiwi extract, Prednisone, Other, Aspirin, and Eletriptan    Review of Systems   Review of Systems  Constitutional:  Negative for chills and fever.  HENT:  Negative for ear pain and sore throat.   Eyes:  Negative for pain and visual disturbance.  Respiratory:  Negative for cough and shortness of breath.   Cardiovascular:  Negative for chest pain and palpitations.  Gastrointestinal:   Negative for abdominal pain and vomiting.  Genitourinary:  Negative for dysuria and hematuria.  Musculoskeletal:  Negative for arthralgias and back pain.  Skin:  Negative for color change and rash.  Neurological:  Positive for dizziness. Negative for seizures and syncope.  All other systems reviewed and are negative.   Physical Exam Updated Vital Signs BP (!) 155/96   Pulse 67   Temp 98.1 F (36.7 C) (Oral)   Resp 13   Ht 5' 5.5" (1.664 m)   Wt 102.1 kg   SpO2 100%   BMI 36.87 kg/m  Physical Exam Vitals and nursing note reviewed.  Constitutional:      General: She is not in acute distress.    Appearance: Normal appearance. She is well-developed. She is obese. She is not ill-appearing or diaphoretic.  HENT:     Head: Normocephalic and atraumatic.     Right Ear: Tympanic membrane, ear canal and external ear normal.     Left Ear: Tympanic membrane, ear canal and external ear normal.     Nose: Nose normal.     Mouth/Throat:     Mouth: Mucous membranes are moist.     Pharynx: Oropharynx is clear.  Eyes:     General: No visual field deficit or scleral icterus.       Right eye: No discharge.        Left eye: No discharge.     Extraocular Movements: Extraocular movements intact.     Right eye: Normal extraocular motion and no nystagmus.     Left eye: Normal extraocular motion and no nystagmus.     Conjunctiva/sclera: Conjunctivae normal.     Pupils: Pupils are equal, round, and reactive to light.  Cardiovascular:     Rate and Rhythm: Normal rate and regular rhythm.     Heart sounds: No murmur heard. Pulmonary:     Effort: Pulmonary effort is normal. No respiratory distress.     Breath sounds: Normal breath sounds.  Abdominal:     General: Bowel sounds are normal.     Palpations: Abdomen is soft.     Tenderness: There is no abdominal tenderness.  Musculoskeletal:        General: No swelling. Normal range of motion.     Cervical back: Neck supple.     Right lower leg: No  edema.     Left lower leg: No edema.  Skin:    General: Skin is warm and dry.     Capillary Refill: Capillary refill takes less than 2 seconds.  Neurological:     General: No focal deficit present.     Mental Status: She is alert and oriented to person, place, and time.     GCS: GCS eye subscore is 4. GCS verbal subscore is 5. GCS motor subscore is 6.  Cranial Nerves: No dysarthria or facial asymmetry.     Sensory: Sensation is intact.     Motor: Motor function is intact.     Coordination: Coordination normal. Finger-Nose-Finger Test and Heel to [facility] Test normal.     Deep Tendon Reflexes: Reflexes are normal and symmetric.     Comments: Cranial 3 through 12 grossly intact.  Muscle strength 5 out of 5 for upper and lower extremities.  Radial pulses and posterior tibial pulses full and intact bilaterally.  No sensory deficits along major nerve distributions of upper or lower extremities.  Psychiatric:        Mood and Affect: Mood normal.        Behavior: Behavior normal.     ED Results / Procedures / Treatments   Labs (all labs ordered are listed, but only abnormal results are displayed) Labs Reviewed  CBC WITH DIFFERENTIAL/PLATELET - Abnormal; Notable for the following components:      Result Value   Hemoglobin 9.6 (*)    HCT 32.1 (*)    MCV 79.7 (*)    MCH 23.8 (*)    MCHC 29.9 (*)    RDW 22.0 (*)    Platelets 405 (*)    All other components within normal limits  COMPREHENSIVE METABOLIC PANEL - Abnormal; Notable for the following components:   Glucose, Bld 102 (*)    All other components within normal limits    EKG EKG Interpretation  Date/Time:  Friday May 26 2022 12:15:53 EDT Ventricular Rate:  80 PR Interval:  145 QRS Duration: 88 QT Interval:  393 QTC Calculation: 454 R Axis:   21 Text Interpretation: Sinus rhythm Low voltage, precordial leads RSR' in V1 or V2, right VCD or RVH ST elev, probable normal early repol pattern Confirmed by Marianna Fuss ([id number])  on 05/26/2022 1:23:13 PM  Radiology No results found.  Procedures Procedures    Medications Ordered in ED Medications  meclizine (ANTIVERT) tablet 25 mg (25 mg Oral Given 05/26/22 1224)    ED Course/ Medical Decision Making/ A&P                           Medical Decision Making  This patient presents to the ED for concern of dizziness, this involves an extensive number of treatment options, and is a complaint that carries with it a high risk of complications and morbidity.  The differential diagnosis includes cva, BPPV, lab otitis, vestibular neuritis, Ramsay Hunt, Mnire's, migraine, alcohol/aminoglycoside ingestion   Co morbidities that complicate the patient evaluation  Obesity   Additional history obtained:  Additional history obtained from CBC from 02/19/2022 indicating baseline hemoglobin External records from outside source obtained and reviewed including baseline hemoglobin 9.1   Lab Tests:  I Ordered, and personally interpreted labs.  The pertinent results include: Hemoglobin 9.6   Imaging Studies ordered:  N/a   Cardiac Monitoring: / EKG:  The patient was maintained on a cardiac monitor.  I personally viewed and interpreted the cardiac monitored which showed an underlying rhythm of: Sinus rhythm   Consultations Obtained:  N/a   Problem List / ED Course / Critical interventions / Medication management  Dizziness I ordered medication including meclizine for dizziness   Reevaluation of the patient after these medicines showed that the patient improved I have reviewed the patients home medicines and have made adjustments as needed   Social Determinants of Health:  Denies tobacco, illicit drug use   Test / Admission -  Considered:  Dizziness Vitals signs significant for hypertension with blood pressure 155/96.  Patient recommended close follow-up with PCP for further blood pressure management. Otherwise within normal range and stable throughout  visit. Laboratory/imaging studies significant for: Baseline anemia. Patient's vertigo like symptoms are likely secondary to peripheral cause given quick onset as well as fluctuating intensity and lack of abnormal cranial nerve findings on exam..  Recommend outpatient therapy with meclizine.  I do not think imaging is necessary at this time.  ENT referral also made for further assessment/evaluation of patient's symptoms. Worrisome signs and symptoms were discussed with the patient, and the patient acknowledged understanding to return to the ED if noticed. Patient was stable upon discharge.          Final Clinical Impression(s) / ED Diagnoses Final diagnoses:  Vertigo    Rx / DC Orders ED Discharge Orders          Ordered    meclizine (ANTIVERT) 25 MG tablet  3 times daily PRN        05/26/22 1320              Peter Garter, [place] 05/26/22 1339    Milagros Loll, MD 05/28/22 [phone]

## 2022-07-04 IMAGING — CR DG LUMBAR SPINE COMPLETE 4+V
5 series · 5 of 5 positions shown · non-contrast
Comparison: Abdominal CT 08/22/2021

CLINICAL DATA: Chronic lower back pain

EXAM:
LUMBAR SPINE - COMPLETE 4+ VIEW

[t lumbar spine ap]
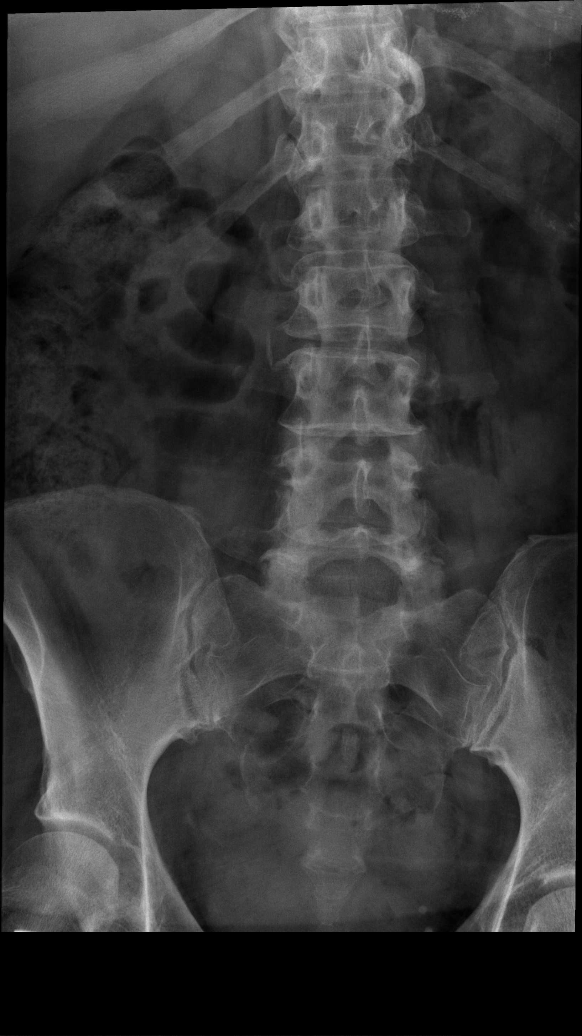

[t lumbar spine obl (1 of 2)]
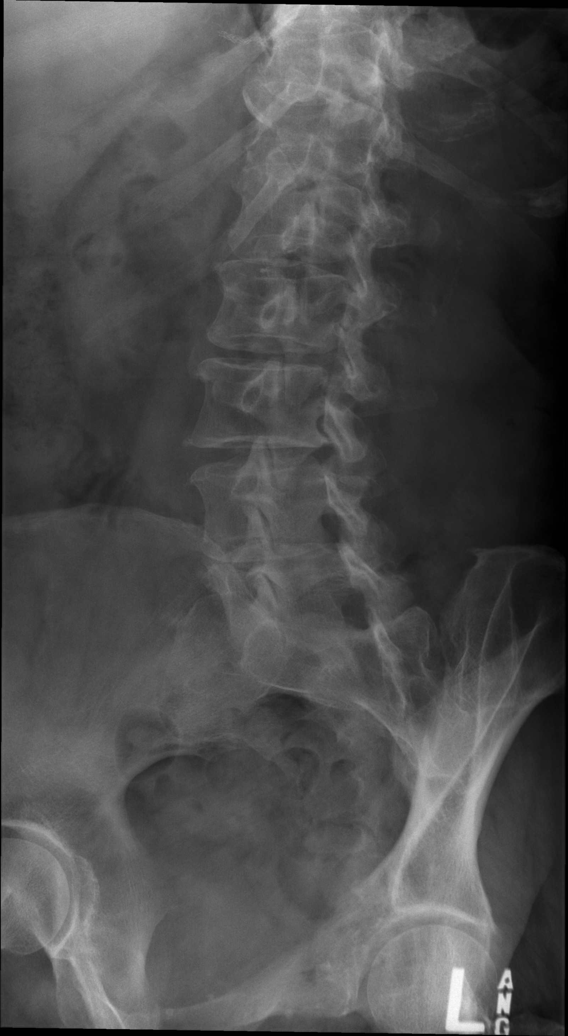

[t lumbar spine obl (2 of 2)]
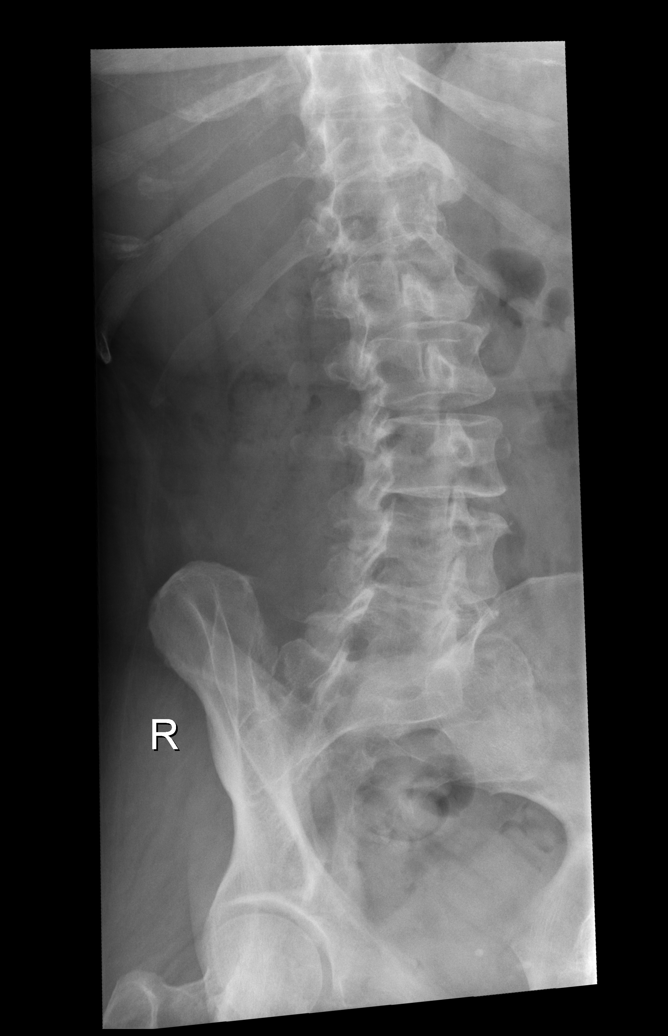

[t lumbar spine lat]
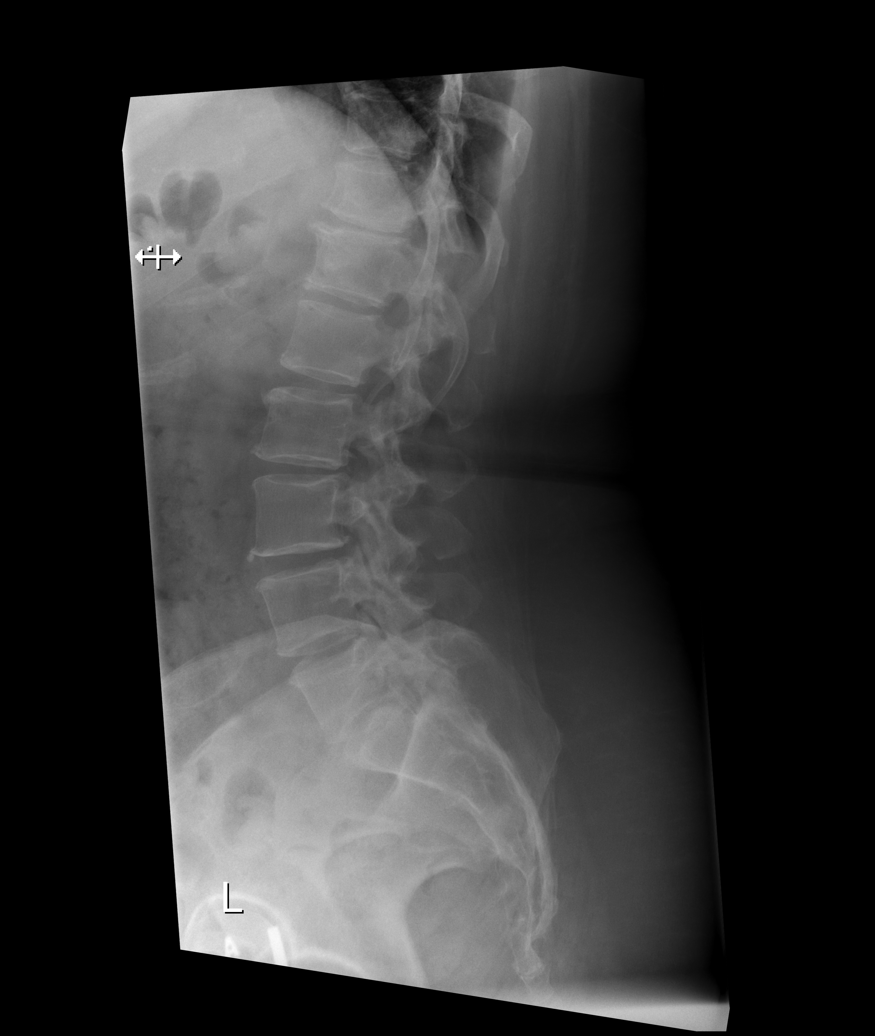

[t lumbar l-5 s-1 spot]
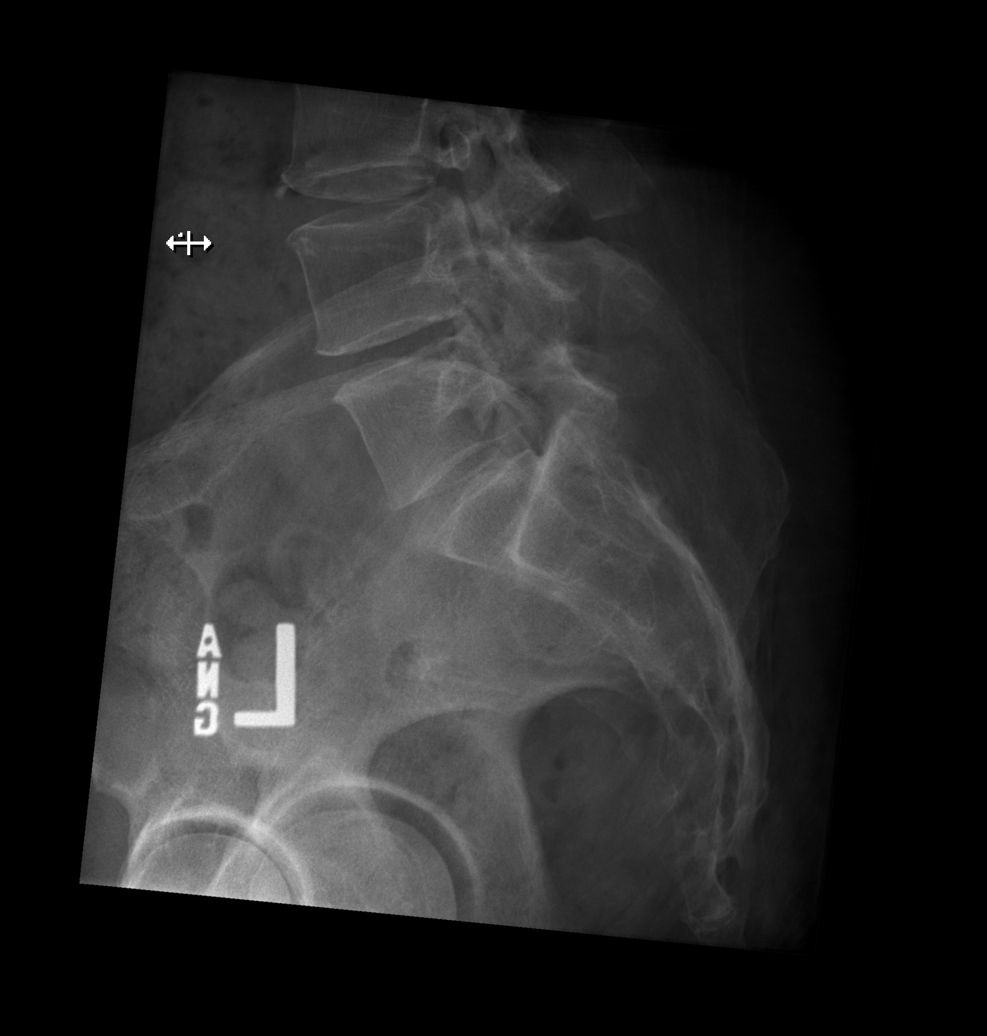

[5 of 5 positions shown; findings below may reference images not displayed]

FINDINGS: No acute fracture, subluxation, or evidence of bone lesion.
Degenerative facet spurring especially at L3-4 and below.
Generalized mild disc space narrowing and endplate spurring.
IMPRESSION: No acute or focal finding.

Generalized degenerative endplate and facet spurring

## 2022-07-28 IMAGING — CT CT ABD-PELV W/ CM
1 of 3 series · 13 of 32 positions shown, 18 images · IV contrast (APPLIED)
Comparison: CT Abdomen and Pelvis 08/22/2021, 10/16/2003.

CLINICAL DATA: 45-year-old female with left lower quadrant pain and
tenderness. History of gastric bypass. Right side palpable mass.
Fatigue and anemia.

EXAM:
CT ABDOMEN AND PELVIS WITH CONTRAST
TECHNIQUE: Multidetector CT imaging of the abdomen and pelvis was performed
using the standard protocol following bolus administration of
intravenous contrast.

[Series 2: abd/pelvis w/cm · axial · 0.96mm/px · z∈[-286,+144]mm · 13 of 100 slices shown, 18 images]
[im 7/100  soft-tissue]
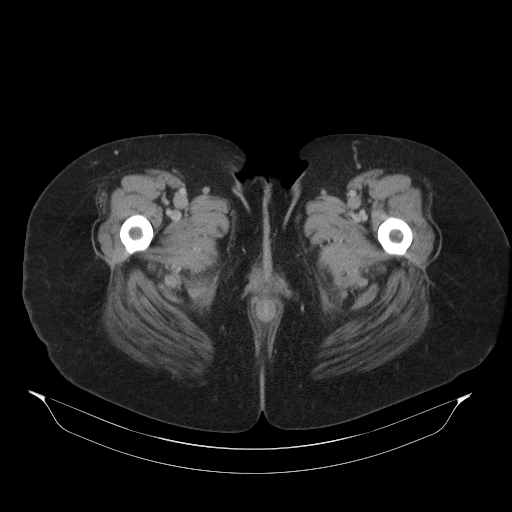
[im 7/100  bone]
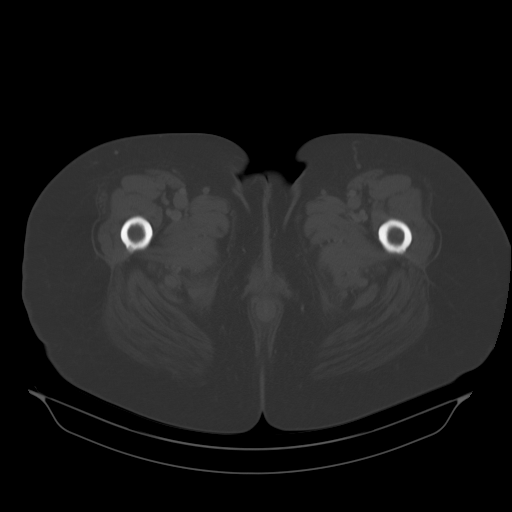
[im 14/100  soft-tissue]
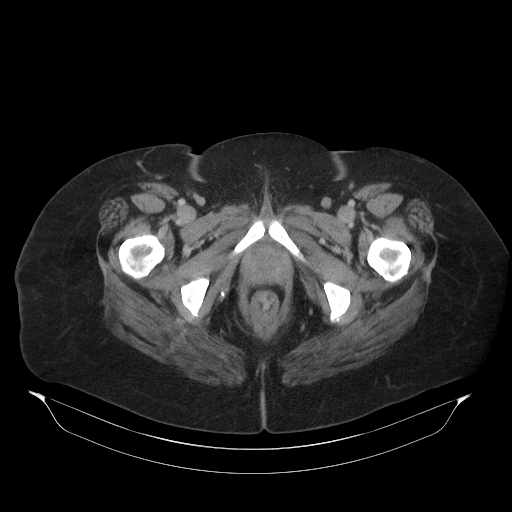
[im 20/100  soft-tissue]
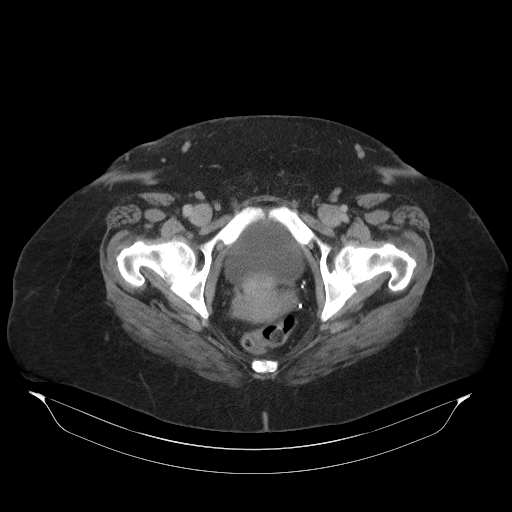
[im 34/100  soft-tissue]
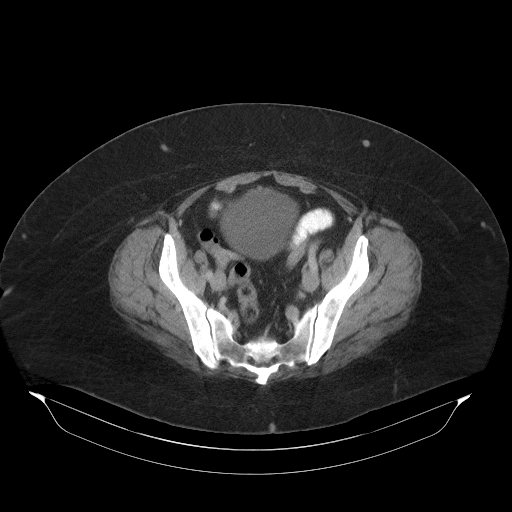
[im 40/100  soft-tissue]
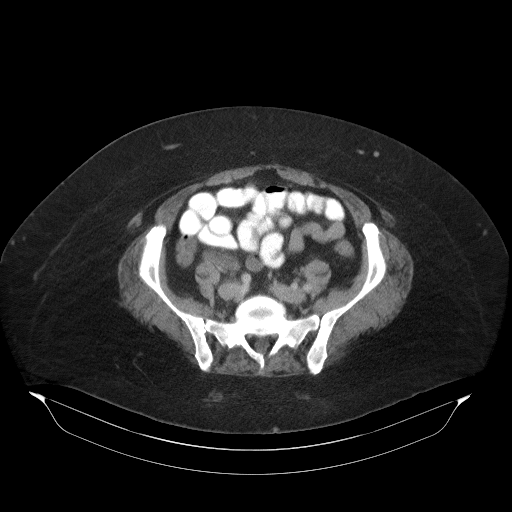
[im 47/100  soft-tissue]
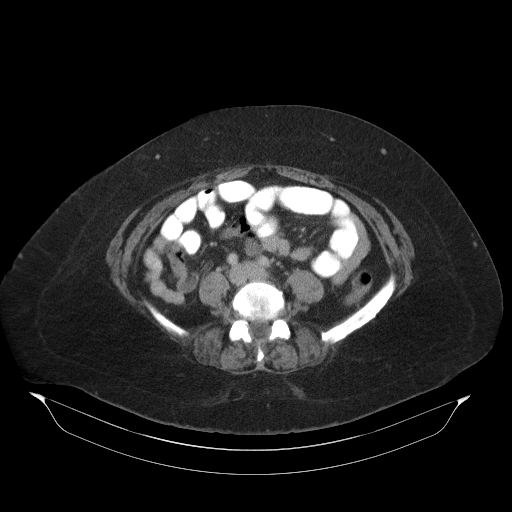
[im 53/100  soft-tissue]
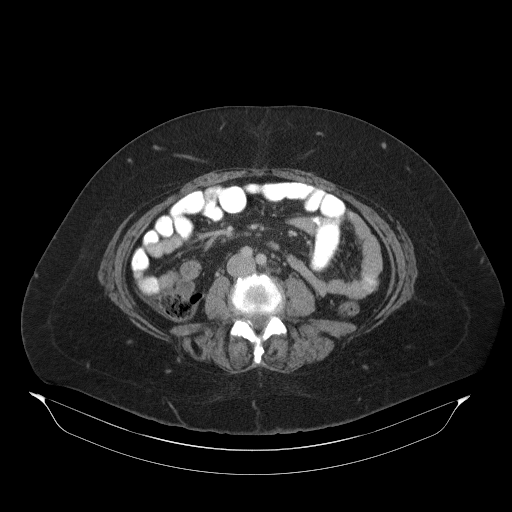
[im 60/100  soft-tissue]
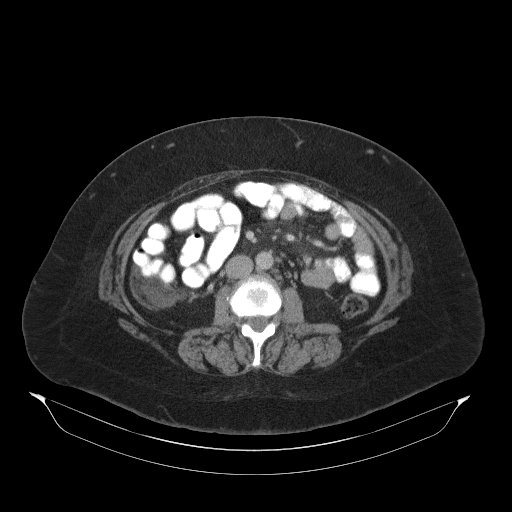
[im 67/100  soft-tissue]
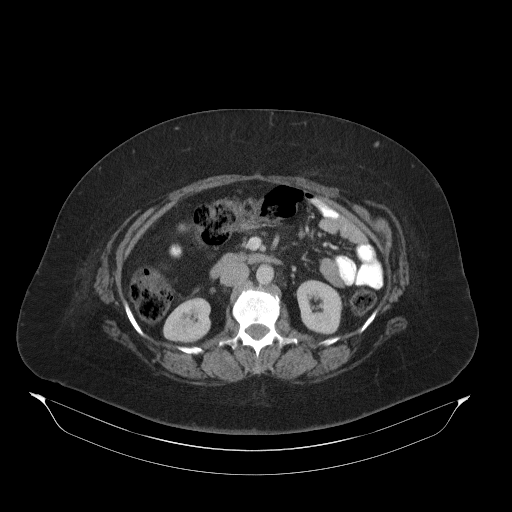
[im 67/100  bone]
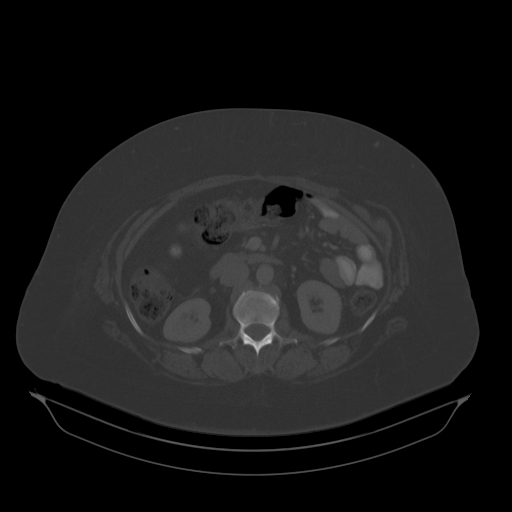
[im 73/100  lung]
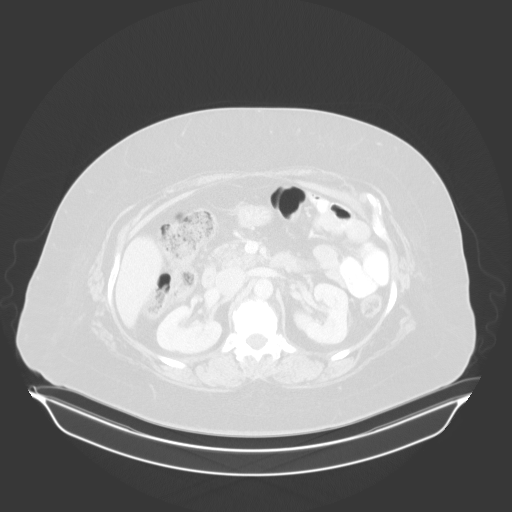
[im 80/100  soft-tissue]
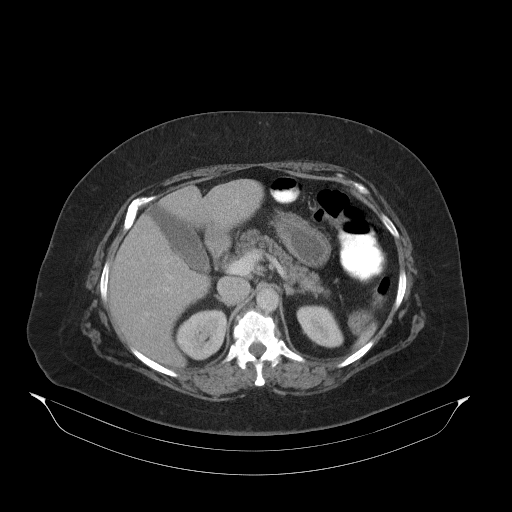
[im 80/100  lung]
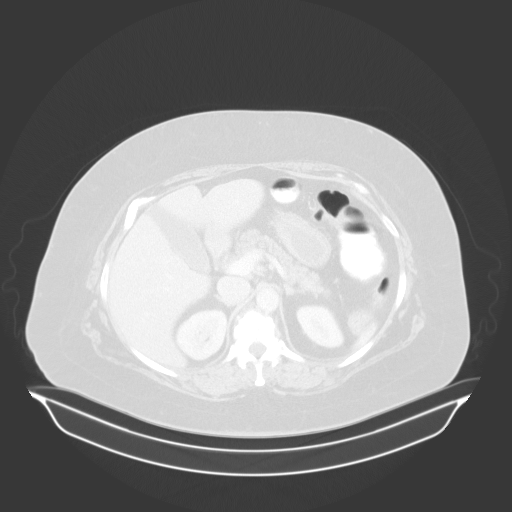
[im 86/100  soft-tissue]
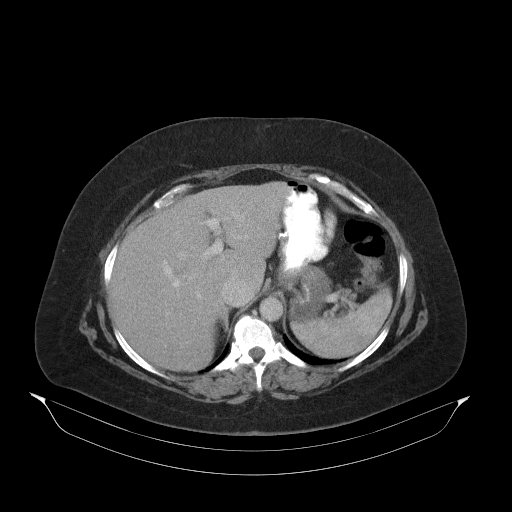
[im 86/100  lung]
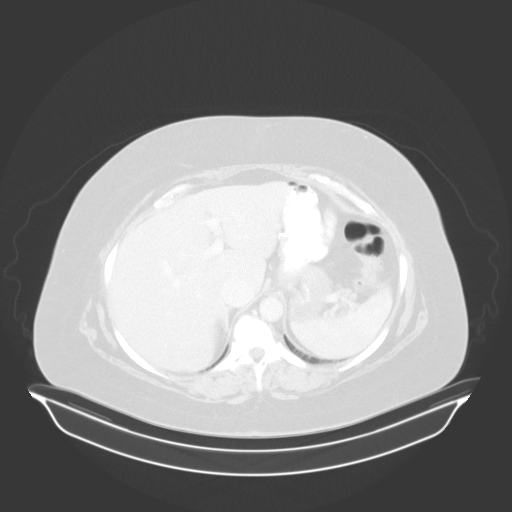
[im 93/100  soft-tissue]
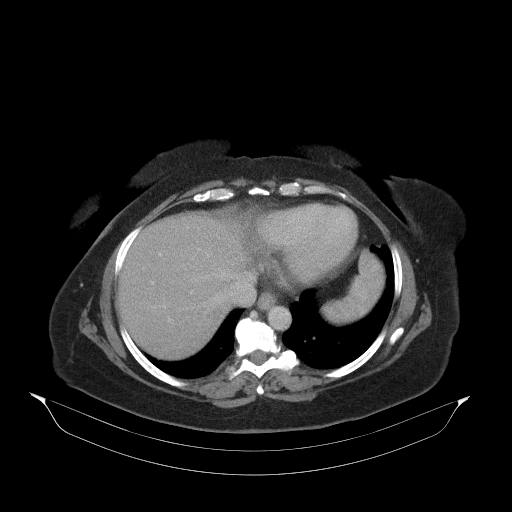
[im 93/100  lung]
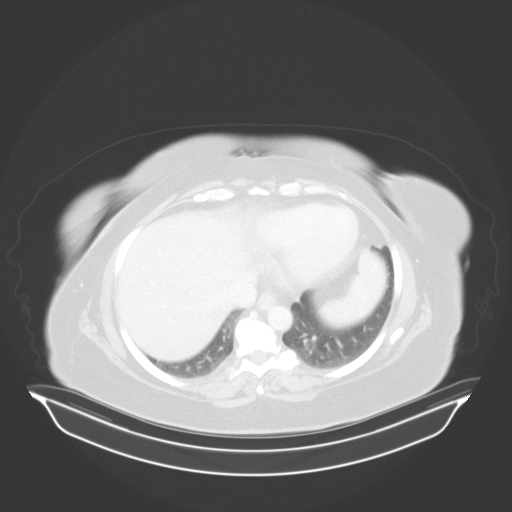

[13 of 32 positions shown; findings below may reference images not displayed]

RADIATION DOSE REDUCTION: This exam was performed according to the
departmental dose-optimization program which includes automated
exposure control, adjustment of the mA and/or kV according to
patient size and/or use of iterative reconstruction technique.

CONTRAST:  100mL 9CAKEC-ZZZ IOPAMIDOL (9CAKEC-ZZZ) INJECTION 61%
FINDINGS: Lower chest: Negative; mild chronic elevation of the right
hemidiaphragm. Cardiac size is at the upper limits of normal. No
pericardial or pleural effusion.

Hepatobiliary: Negative visible liver, gallbladder.

Pancreas: Negative.

Spleen: Negative.

Adrenals/Urinary Tract: Negative. Normal renal enhancement and
contrast excretion. Incidental pelvic phleboliths.

Stomach/Bowel: Redundant but decompressed colon in the pelvis. Mild
retained stool elsewhere in the large bowel. No large bowel
inflammation. Normal appendix on series 2, image 52. Oral contrast
has not yet reached the distal small bowel. Terminal ileum appears
negative. No dilated small bowel. Contrast opacified proximal and
mid small bowel loops appear negative.

Sequelae of gastrojejunostomy, Roux-en-Y type gastric bypass with no
adverse features. No contrast in the bypassed portion of the stomach
or duodenum. No free air, free fluid, mesenteric inflammation.

Vascular/Lymphatic: Major arterial structures in the abdomen and
pelvis are patent. Portal venous system is patent. No
lymphadenopathy identified.

Reproductive: Evidence of a mildly exophytic but probably intramural
ventral uterine fibroid measuring about 3 cm on series 4, image 89,
stable from last year. Otherwise negative.

Other: No pelvic free fluid.

No superficial marked area of clinical concern is identified. No
right side abdominal wall mass or lesion is identified.

Musculoskeletal: No acute osseous abnormality identified.
IMPRESSION: 1. No acute or inflammatory process identified in the abdomen or
pelvis.
2. Sequelae of Roux-en-Y type gastric bypass with no adverse
features.
3. Chronic 3 cm uterine fibroid.

## 2024-07-09 ENCOUNTER — Other Ambulatory Visit: Payer: Self-pay | Admitting: Medical Genetics

## 2024-10-07 ENCOUNTER — Other Ambulatory Visit: Payer: Self-pay | Admitting: Medical Genetics

## 2024-10-07 DIAGNOSIS — Z006 Encounter for examination for normal comparison and control in clinical research program: Secondary | ICD-10-CM
